# Patient Record
Sex: Male | Born: 1955 | Marital: Married | Smoking: Current every day smoker
Health system: Southern US, Community
[De-identification: ages and names within clinical notes are randomized; demographics above are authoritative.]

## PROBLEM LIST (undated history)

## (undated) DIAGNOSIS — I10 Essential (primary) hypertension: Secondary | ICD-10-CM

## (undated) HISTORY — DX: Essential (primary) hypertension: I10

---

## 2013-05-17 ENCOUNTER — Other Ambulatory Visit: Payer: Self-pay

## 2013-05-17 ENCOUNTER — Other Ambulatory Visit: Payer: Self-pay | Admitting: Infectious Diseases

## 2013-05-17 DIAGNOSIS — R7611 Nonspecific reaction to tuberculin skin test without active tuberculosis: Secondary | ICD-10-CM

## 2013-05-21 ENCOUNTER — Ambulatory Visit
Admission: RE | Admit: 2013-05-21 | Discharge: 2013-05-21 | Disposition: A | Discharge status: Home / Self Care | Payer: No Typology Code available for payment source | Source: Ambulatory Visit | Attending: Infectious Diseases | Admitting: Infectious Diseases

## 2013-05-21 DIAGNOSIS — R7611 Nonspecific reaction to tuberculin skin test without active tuberculosis: Secondary | ICD-10-CM

## 2013-12-26 DIAGNOSIS — I10 Essential (primary) hypertension: Secondary | ICD-10-CM

## 2013-12-26 HISTORY — DX: Essential (primary) hypertension: I10

## 2015-07-28 ENCOUNTER — Encounter (HOSPITAL_COMMUNITY): Payer: Self-pay | Admitting: Emergency Medicine

## 2015-07-28 ENCOUNTER — Emergency Department (INDEPENDENT_AMBULATORY_CARE_PROVIDER_SITE_OTHER)
Admission: EM | Admit: 2015-07-28 | Discharge: 2015-07-28 | Disposition: A | Discharge status: Home / Self Care | Payer: Self-pay | Source: Home / Self Care

## 2015-07-28 DIAGNOSIS — I160 Hypertensive urgency: Secondary | ICD-10-CM

## 2015-07-28 DIAGNOSIS — F17200 Nicotine dependence, unspecified, uncomplicated: Secondary | ICD-10-CM

## 2015-07-28 DIAGNOSIS — F1020 Alcohol dependence, uncomplicated: Secondary | ICD-10-CM

## 2015-07-28 MED ORDER — METOPROLOL SUCCINATE ER 25 MG PO TB24: 25.0000 mg | ORAL_TABLET | Freq: Every day | ORAL | Status: DC

## 2015-07-28 NOTE — Discharge Instructions (Signed)
I strongly recommend that you try to cut down on her drinking and then try to cut down on smoking. You probably have some damage to your liver because of your drinking and I would recommend that when you go to the [facility] on [address] within a week you get some blood work done to rule out any abnormalities of your liver. Alcohol can cause various abnormalities in your body and your high blood pressure and your fast heart rate may be related to probably the use of alcohol and its withdrawal from her system. Please consider getting some help in order to get off of the alcohol if you feel like you are ready to do so   Alcohol Use Disorder Alcohol use disorder is a mental disorder. It is not a one-time incident of heavy drinking. Alcohol use disorder is the excessive and uncontrollable use of alcohol over time that leads to problems with functioning in one or more areas of daily living. People with this disorder risk harming themselves and others when they drink to excess. Alcohol use disorder also can cause other mental disorders, such as mood and anxiety disorders, and serious physical problems. People with alcohol use disorder often misuse other drugs.  Alcohol use disorder is common and widespread. Some people with this disorder drink alcohol to cope with or escape from negative life events. Others drink to relieve chronic pain or symptoms of mental illness. People with a family history of alcohol use disorder are at higher risk of losing control and using alcohol to excess.  SYMPTOMS  Signs and symptoms of alcohol use disorder may include the following:   Consumption ofalcohol inlarger amounts or over a longer period of time than intended.  Multiple unsuccessful attempts to cutdown or control alcohol use.   A great deal of time spent obtaining alcohol, using alcohol, or recovering from the effects of alcohol (hangover).  A strong desire or urge to use alcohol (cravings).   Continued  use of alcohol despite problems at work, school, or home because of alcohol use.   Continued use of alcohol despite problems in relationships because of alcohol use.  Continued use of alcohol in situations when it is physically hazardous, such as driving a car.  Continued use of alcohol despite awareness of a physical or psychological problem that is likely related to alcohol use. Physical problems related to alcohol use can involve the brain, heart, liver, stomach, and intestines. Psychological problems related to alcohol use include intoxication, depression, anxiety, psychosis, delirium, and dementia.   The need for increased amounts of alcohol to achieve the same desired effect, or a decreased effect from the consumption of the same amount of alcohol (tolerance).  Withdrawal symptoms upon reducing or stopping alcohol use, or alcohol use to reduce or avoid withdrawal symptoms. Withdrawal symptoms include:  Racing heart.  Hand tremor.  Difficulty sleeping.  Nausea.  Vomiting.  Hallucinations.  Restlessness.  Seizures. DIAGNOSIS Alcohol use disorder is diagnosed through an assessment by your health care provider. Your health care provider may start by asking three or four questions to screen for excessive or problematic alcohol use. To confirm a diagnosis of alcohol use disorder, at least two symptoms must be present within a 38-month period. The severity of alcohol use disorder depends on the number of symptoms:  Mild--two or three.  Moderate--four or five.  Severe--six or more. Your health care provider may perform a physical exam or use results from lab tests to see if you have physical problems  resulting from alcohol use. Your health care provider may refer you to a mental health professional for evaluation. TREATMENT  Some people with alcohol use disorder are able to reduce their alcohol use to low-risk levels. Some people with alcohol use disorder need to quit drinking  alcohol. When necessary, mental health professionals with specialized training in substance use treatment can help. Your health care provider can help you decide how severe your alcohol use disorder is and what type of treatment you need. The following forms of treatment are available:   Detoxification. Detoxification involves the use of prescription medicines to prevent alcohol withdrawal symptoms in the first week after quitting. This is important for people with a history of symptoms of withdrawal and for heavy drinkers who are likely to have withdrawal symptoms. Alcohol withdrawal can be dangerous and, in severe cases, cause death. Detoxification is usually provided in a hospital or in-patient substance use treatment facility.  Counseling or talk therapy. Talk therapy is provided by substance use treatment counselors. It addresses the reasons people use alcohol and ways to keep them from drinking again. The goals of talk therapy are to help people with alcohol use disorder find healthy activities and ways to cope with life stress, to identify and avoid triggers for alcohol use, and to handle cravings, which can cause relapse.  Medicines.Different medicines can help treat alcohol use disorder through the following actions:  Decrease alcohol cravings.  Decrease the positive reward response felt from alcohol use.  Produce an uncomfortable physical reaction when alcohol is used (aversion therapy).  Support groups. Support groups are run by people who have quit drinking. They provide emotional support, advice, and guidance. These forms of treatment are often combined. Some people with alcohol use disorder benefit from intensive combination treatment provided by specialized substance use treatment centers. Both inpatient and outpatient treatment programs are available. Document Released: 01/19/2005 Document Revised: 04/28/2014 Document Reviewed: 03/21/2013 [facility] Patient Information 2015  [place], [place]. This information is not intended to replace advice given to you by your health care provider. Make sure you discuss any questions you have with your health care provider.

## 2015-07-28 NOTE — ED Provider Notes (Signed)
CSN: [id number]     Arrival date & time 07/28/15  1516 History   First MD Initiated Contact with Patient 07/28/15 1525     No chief complaint on file.  HPI  59 y/o ? recent [place] to [place] from [place] [facility] [address] No known medical illnesses per Interpreter Smoker-smokes ~ 1 pack q 3 says Etoh-occasional Not on any medicaton currently No prior operation before vision is fine-no Glasses  Used to work at [employer] get there as doesn't drive He tells me that he is applying for a job at [employer] and is able to get work there as early as 07/29/15 if he gets a work note saying that he is permitted to do so  His father and mother died in the 40s of old age she does not know from what He has not ever been on any medication in the past He drinks 1 40 of beer a day and smokes as well as uses chewable tobacco No illicit drug use  He has 3 children and lives with his wife here in [place] He has never withdrawn from his alcohol and does not seem amenable to quitting  He has no drug allergies or any other issues that he is aware of  From what I am able to tell he tells me he has never been on any blood pressure medication   BP 168/90 mmHg  Pulse 106  Temp(Src) 99.2 F (37.3 C) (Oral)  Resp 16  SpO2 97%   No past medical history on file. No past surgical history on file. No family history on file. History  Substance Use Topics  . Smoking status: Not on file  . Smokeless tobacco: Not on file  . Alcohol Use: Not on file    Review of Systems  - Fever, minus and chill, - dysuria, - dark stool, - tarry stool, minus abdominal pain, some nausea, and some vomiting, - weight loss, - rash  + Icterus   Allergies  Review of patient's allergies indicates not on file.  Home Medications   Prior to Admission medications   Not on File   There were no vitals taken for this visit. Physical Exam Slightly built moderately  nourished, pleasant, scleral icterus bilaterally with injection/sob conjunctiva hemorrhage to the left eye lower iris area  Very poor dentition with multiple caries and eroded teeth  No thyromegaly, no submandibular submental lymphadenopathy  Tachycardic 100 range   clinically clear no added sound no rales no rhonchi no fremitus no resonance Abdomen is soft I think I feel some right subcostal fullness which may be his liver however I am not sure given he guards No lower extremity edema Tattoo right arm and the left arm Neurologically intact, musculoskeletal able to move shoulders in all 4 cardinal positions including flexion and extension and rotation chin to chest flexion and extension are normal as is axial rotation of the head Patient is able to forward flex and backward extend at the hip without limitation Musci skeletal exam in the lower extremities is normal in terms of plantar and dorsiflexion as well as hip flexion and extension gait is normal   ED Course  Procedures (including critical care time) Labs Review Labs Reviewed - No data to display  Imaging Review No results found.   MDM   1. Asymptomatic hypertensive urgency   2. Smoker unmotivated to quit   3. ETOHism    patient comes in for a general physical in  order to be able to be cleared for work at [employer]. I have cleared him for work from a Education officer, environmental standpoint however I feel biochemically he may have ethanolism and will need complete metabolic panel, CBC, INR, hepatitis workup given the fact that he is clearly icteric on exam. As he however is able to function and has not had any blackout spells per the interpreter and based on what I'm able to discern as the patient does speak [place]ese, I do feel that he needs close attention at the health department for further screening and preventive care in terms of attention a hypothyroid, hepatitis, lung cancer, hepatitis and diabetes guidelines as per [place] PT[facility]. He  will be given a letter in order for him to obtain work at [employer] I have made it clear to the interpreter that he will need to get some blood pressure medication started and I have started a selective beta blocker metoprolol XL 25 mg daily as it does not cross the blood-brain barrier and hence would have lower propensity to cause depression or anorgasmia/ejection icteric problems in males. I do think however that he will need close follow-up and I have mentioned this to his interpreter to make sure that this occurs.  Everything was carefully explained to the patient via the interpreter  Corey Skains, MD 07/28/15 [phone]

## 2015-07-28 NOTE — ED Notes (Signed)
Via [place]ese interpreter... Pt is a refugee and is need of medical clearance so he may begin to work [employer] no other concerns... No PCP Alert, no acute distress.

## 2016-04-13 ENCOUNTER — Ambulatory Visit: Payer: Self-pay

## 2016-05-04 ENCOUNTER — Ambulatory Visit: Payer: Self-pay

## 2016-09-21 ENCOUNTER — Ambulatory Visit (INDEPENDENT_AMBULATORY_CARE_PROVIDER_SITE_OTHER): Payer: Self-pay | Admitting: Internal Medicine

## 2016-09-21 ENCOUNTER — Encounter: Payer: Self-pay | Admitting: Internal Medicine

## 2016-09-21 VITALS — BP 168/98 | HR 82 | Temp 98.6°F | Ht 66.0 in | Wt 129.5 lb

## 2016-09-21 DIAGNOSIS — I10 Essential (primary) hypertension: Secondary | ICD-10-CM

## 2016-09-21 DIAGNOSIS — R21 Rash and other nonspecific skin eruption: Secondary | ICD-10-CM

## 2016-09-21 MED ORDER — METOPROLOL TARTRATE 25 MG PO TABS: 25.0000 mg | ORAL_TABLET | Freq: Two times a day (BID) | ORAL | 11 refills | Status: AC

## 2016-09-21 MED ORDER — TRIAMCINOLONE ACETONIDE 0.1 % EX CREA: TOPICAL_CREAM | CUTANEOUS | 1 refills | Status: AC

## 2016-09-21 NOTE — Progress Notes (Signed)
Subjective:    Patient ID: Jonathon Dennis, male    DOB: 03/19/1956, 60 y.o.   MRN: [id number]  HPI   New to establish  1.  Elevated BP:  Has been told his blood pressure is high in past--2 years ago.  Has never had treatment.  (later see he was prescribed Metoprolol, but he took only for 2 weeks) He is physically active.  Unemployed.  Eats lots of fruits and vegetables in his diet.   2.  Rash:   Has had rash for 1 month.  Has never had before.  Started on inner thighs of both legs.  Now involving all of legs.   Is pruritic No pets or animals about.   No problems with insects in his home.   No new topical or other type contacts to skin.   He did walk [place] the creek to fish/gets snails for soup.  Does not recall going through weeds, however. Does not recall name of soap  No outpatient prescriptions have been marked as taking for the 09/21/16 encounter (Office Visit) with Julieanne MansonElizabeth Anorah Trias, MD.   No Known Allergies   Past Medical History:  Diagnosis Date  . Hypertension 2015   History reviewed. No pertinent surgical history.   History reviewed. No pertinent family history.   Social History   Social History  . Marital status: Married    Spouse name: Matachi  . Number of children: 4  . Years of education: N/A   Occupational History  . unemployed     Previously worked at [employer], but does not drive.    .      Takes care of 2 grandchildren (daughter's refugee paperwork did not come through) and 1 son.   Social History Main Topics  . Smoking status: Current Every Day Smoker    Packs/day: 0.25    Years: 8.00    Types: Cigarettes  . Smokeless tobacco: Never Used  . Alcohol use Yes     Comment: drinks liquor once weekly with friends--not able to clarify, very  vague.  . Drug use: No  . Sexual activity: Not on file   Other Topics Concern  . Not on file   Social History Narrative   Originally from [place]   In [place] years   Lives at home with  wife, 58 yo son with developmental disability from sounds of it and 2 grandchildren (his daughter's papers did not come through, so she could not come as a refugee)   Speaks Burmese     Review of Systems     Objective:   Physical Exam  NAD HEENT:  PERRL, EOMI, TMs pearly gray, throat without injection. Neck:  Supple no adenopathy, no thyromegaly Chest:  CTA CV:  RRR with normal S1 and S2, No S3, S4 or murmur, radial and DP pulses normal and equal. Legs and feet:  Large areas of circular violaceous base with overlying dry flaky skin, lower legs and onto ankles, dorsal feet. Appears to have separate thickened flaky skin involving toes, including interdigital spaces      Assessment & Plan:  1.  Leg rash:  Not clear what this is.  Appears to be possibly two separate problems.  Triamcinolone and Terbinafine cream twice daily to feet.  Not to use triamcinolone on toes or inderdigital spaces.   To let us know if no better when returns for bp check in 2 weeks.  2.  Essential Hypertension:  Start Metoprolol twice daily 25  mg.  Bp/pulse  check 2 weeks.

## 2016-09-21 NOTE — Patient Instructions (Addendum)
Dove soap Terbinafine cream to dry rashy skin on feet twice daily, especially after shower Triamcinolone Cream (the prescription cream) over legs and ankles and tops of feet where rash is, but not between toes or on bottoms of feet. Cover all skin twice daily with Eucerin Cream for Eczema Relief

## 2016-10-05 ENCOUNTER — Ambulatory Visit (INDEPENDENT_AMBULATORY_CARE_PROVIDER_SITE_OTHER): Payer: Self-pay | Admitting: Internal Medicine

## 2016-10-05 ENCOUNTER — Encounter: Payer: Self-pay | Admitting: Internal Medicine

## 2016-10-05 VITALS — BP 118/80 | HR 82

## 2016-10-05 DIAGNOSIS — I10 Essential (primary) hypertension: Secondary | ICD-10-CM

## 2016-10-05 NOTE — Progress Notes (Signed)
Here for nurse visit blood pressure check. Measurements by Richrd SoxEstefania Alfaro Ruiz  Discussed his bp is fine.  Tolerating med fine.

## 2016-11-23 ENCOUNTER — Ambulatory Visit: Payer: Self-pay | Admitting: Internal Medicine

## 2019-03-09 ENCOUNTER — Emergency Department (HOSPITAL_COMMUNITY): Payer: Self-pay

## 2019-03-09 ENCOUNTER — Inpatient Hospital Stay (HOSPITAL_COMMUNITY)
Admission: EM | Admit: 2019-03-09 | Discharge: 2019-03-27 | DRG: 082 | Disposition: E | Payer: Self-pay | Attending: Pulmonary Disease | Admitting: Pulmonary Disease

## 2019-03-09 ENCOUNTER — Encounter (HOSPITAL_COMMUNITY): Payer: Self-pay | Admitting: Neurological Surgery

## 2019-03-09 ENCOUNTER — Other Ambulatory Visit: Payer: Self-pay

## 2019-03-09 DIAGNOSIS — Z7189 Other specified counseling: Secondary | ICD-10-CM

## 2019-03-09 DIAGNOSIS — S0291XA Unspecified fracture of skull, initial encounter for closed fracture: Secondary | ICD-10-CM

## 2019-03-09 DIAGNOSIS — K920 Hematemesis: Secondary | ICD-10-CM

## 2019-03-09 DIAGNOSIS — E440 Moderate protein-calorie malnutrition: Secondary | ICD-10-CM

## 2019-03-09 DIAGNOSIS — W109XXA Fall (on) (from) unspecified stairs and steps, initial encounter: Secondary | ICD-10-CM | POA: Diagnosis present

## 2019-03-09 DIAGNOSIS — Z4659 Encounter for fitting and adjustment of other gastrointestinal appliance and device: Secondary | ICD-10-CM

## 2019-03-09 DIAGNOSIS — Z9911 Dependence on respirator [ventilator] status: Secondary | ICD-10-CM

## 2019-03-09 DIAGNOSIS — R4701 Aphasia: Secondary | ICD-10-CM | POA: Diagnosis present

## 2019-03-09 DIAGNOSIS — Z6820 Body mass index (BMI) 20.0-20.9, adult: Secondary | ICD-10-CM

## 2019-03-09 DIAGNOSIS — K922 Gastrointestinal hemorrhage, unspecified: Secondary | ICD-10-CM | POA: Diagnosis present

## 2019-03-09 DIAGNOSIS — J9601 Acute respiratory failure with hypoxia: Secondary | ICD-10-CM | POA: Diagnosis present

## 2019-03-09 DIAGNOSIS — Y92003 Bedroom of unspecified non-institutional (private) residence as the place of occurrence of the external cause: Secondary | ICD-10-CM

## 2019-03-09 DIAGNOSIS — G919 Hydrocephalus, unspecified: Secondary | ICD-10-CM | POA: Diagnosis present

## 2019-03-09 DIAGNOSIS — Z66 Do not resuscitate: Secondary | ICD-10-CM | POA: Diagnosis present

## 2019-03-09 DIAGNOSIS — D649 Anemia, unspecified: Secondary | ICD-10-CM | POA: Diagnosis present

## 2019-03-09 DIAGNOSIS — I619 Nontraumatic intracerebral hemorrhage, unspecified: Secondary | ICD-10-CM | POA: Diagnosis present

## 2019-03-09 DIAGNOSIS — J189 Pneumonia, unspecified organism: Secondary | ICD-10-CM | POA: Diagnosis not present

## 2019-03-09 DIAGNOSIS — G9349 Other encephalopathy: Secondary | ICD-10-CM | POA: Diagnosis present

## 2019-03-09 DIAGNOSIS — F17210 Nicotine dependence, cigarettes, uncomplicated: Secondary | ICD-10-CM | POA: Diagnosis present

## 2019-03-09 DIAGNOSIS — R64 Cachexia: Secondary | ICD-10-CM | POA: Diagnosis present

## 2019-03-09 DIAGNOSIS — E785 Hyperlipidemia, unspecified: Secondary | ICD-10-CM | POA: Diagnosis present

## 2019-03-09 DIAGNOSIS — R159 Full incontinence of feces: Secondary | ICD-10-CM | POA: Diagnosis present

## 2019-03-09 DIAGNOSIS — G8191 Hemiplegia, unspecified affecting right dominant side: Secondary | ICD-10-CM | POA: Diagnosis present

## 2019-03-09 DIAGNOSIS — S0280XA Fracture of other specified skull and facial bones, unspecified side, initial encounter for closed fracture: Secondary | ICD-10-CM | POA: Diagnosis present

## 2019-03-09 DIAGNOSIS — W19XXXA Unspecified fall, initial encounter: Secondary | ICD-10-CM

## 2019-03-09 DIAGNOSIS — R0902 Hypoxemia: Secondary | ICD-10-CM

## 2019-03-09 DIAGNOSIS — F1010 Alcohol abuse, uncomplicated: Secondary | ICD-10-CM

## 2019-03-09 DIAGNOSIS — D696 Thrombocytopenia, unspecified: Secondary | ICD-10-CM | POA: Diagnosis present

## 2019-03-09 DIAGNOSIS — E876 Hypokalemia: Secondary | ICD-10-CM | POA: Diagnosis not present

## 2019-03-09 DIAGNOSIS — F1020 Alcohol dependence, uncomplicated: Secondary | ICD-10-CM | POA: Diagnosis present

## 2019-03-09 DIAGNOSIS — Y95 Nosocomial condition: Secondary | ICD-10-CM | POA: Diagnosis not present

## 2019-03-09 DIAGNOSIS — I10 Essential (primary) hypertension: Secondary | ICD-10-CM | POA: Diagnosis present

## 2019-03-09 DIAGNOSIS — G935 Compression of brain: Secondary | ICD-10-CM

## 2019-03-09 DIAGNOSIS — G936 Cerebral edema: Secondary | ICD-10-CM

## 2019-03-09 DIAGNOSIS — Z515 Encounter for palliative care: Secondary | ICD-10-CM | POA: Diagnosis present

## 2019-03-09 DIAGNOSIS — S066X9A Traumatic subarachnoid hemorrhage with loss of consciousness of unspecified duration, initial encounter: Principal | ICD-10-CM | POA: Diagnosis present

## 2019-03-09 LAB — COMPREHENSIVE METABOLIC PANEL
ALT: 56 U/L — AB (ref 0–44)
AST: 86 U/L — AB (ref 15–41)
Albumin: 3.6 g/dL (ref 3.5–5.0)
Alkaline Phosphatase: 64 U/L (ref 38–126)
Anion gap: 19 — ABNORMAL HIGH (ref 5–15)
BUN: 18 mg/dL (ref 8–23)
CO2: 19 mmol/L — ABNORMAL LOW (ref 22–32)
CREATININE: 0.72 mg/dL (ref 0.61–1.24)
Calcium: 9.1 mg/dL (ref 8.9–10.3)
Chloride: 96 mmol/L — ABNORMAL LOW (ref 98–111)
GFR calc Af Amer: >60 mL/min (ref >60)
GFR calc non Af Amer: >60 mL/min (ref >60)
Glucose, Bld: 163 mg/dL — ABNORMAL HIGH (ref 70–99)
Potassium: 4.1 mmol/L (ref 3.5–5.1)
Sodium: 134 mmol/L — ABNORMAL LOW (ref 135–145)
Total Bilirubin: 1.1 mg/dL (ref 0.3–1.2)
Total Protein: 9.1 g/dL — ABNORMAL HIGH (ref 6.5–8.1)

## 2019-03-09 LAB — CBC WITH DIFFERENTIAL/PLATELET
Abs Immature Granulocytes: 0.03 10*3/uL (ref 0.00–0.07)
Basophils Absolute: 0 10*3/uL (ref 0.0–0.1)
Basophils Relative: 0 %
Eosinophils Absolute: 0 10*3/uL (ref 0.0–0.5)
Eosinophils Relative: 0 %
HCT: 33 % — ABNORMAL LOW (ref 39.0–52.0)
Hemoglobin: 10.5 g/dL — ABNORMAL LOW (ref 13.0–17.0)
Immature Granulocytes: 0 %
Lymphocytes Relative: 6 %
Lymphs Abs: 0.6 10*3/uL — ABNORMAL LOW (ref 0.7–4.0)
MCH: 20 pg — ABNORMAL LOW (ref 26.0–34.0)
MCHC: 31.8 g/dL (ref 30.0–36.0)
MCV: 63 fL — ABNORMAL LOW (ref 80.0–100.0)
Monocytes Absolute: 0.6 10*3/uL (ref 0.1–1.0)
Monocytes Relative: 6 %
Neutro Abs: 8.3 10*3/uL — ABNORMAL HIGH (ref 1.7–7.7)
Neutrophils Relative %: 88 %
Platelets: 147 10*3/uL — ABNORMAL LOW (ref 150–400)
RBC: 5.24 MIL/uL (ref 4.22–5.81)
RDW: 16.4 % — ABNORMAL HIGH (ref 11.5–15.5)
WBC: 9.5 10*3/uL (ref 4.0–10.5)
nRBC: 0.3 % — ABNORMAL HIGH (ref 0.0–0.2)

## 2019-03-09 LAB — TYPE AND SCREEN
ABO/RH(D): O POS
ANTIBODY SCREEN: NEGATIVE

## 2019-03-09 LAB — RAPID URINE DRUG SCREEN, HOSP PERFORMED
Amphetamines: NOT DETECTED
BARBITURATES: NOT DETECTED
Benzodiazepines: NOT DETECTED
Cocaine: NOT DETECTED
Opiates: NOT DETECTED
Tetrahydrocannabinol: NOT DETECTED

## 2019-03-09 LAB — POCT I-STAT 7, (LYTES, BLD GAS, ICA,H+H)
Acid-Base Excess: 2 mmol/L (ref 0.0–2.0)
Bicarbonate: 26.1 mmol/L (ref 20.0–28.0)
Calcium, Ion: 1.1 mmol/L — ABNORMAL LOW (ref 1.15–1.40)
HCT: 27 % — ABNORMAL LOW (ref 39.0–52.0)
Hemoglobin: 9.2 g/dL — ABNORMAL LOW (ref 13.0–17.0)
O2 Saturation: 100 %
PO2 ART: 317 mmHg — AB (ref 83.0–108.0)
Potassium: 4.5 mmol/L (ref 3.5–5.1)
Sodium: 135 mmol/L (ref 135–145)
TCO2: 27 mmol/L (ref 22–32)
pCO2 arterial: 36.5 mmHg (ref 32.0–48.0)
pH, Arterial: 7.462 — ABNORMAL HIGH (ref 7.350–7.450)

## 2019-03-09 LAB — POC OCCULT BLOOD, ED: Fecal Occult Bld: NEGATIVE

## 2019-03-09 LAB — URINALYSIS, ROUTINE W REFLEX MICROSCOPIC
Bacteria, UA: NONE SEEN
Bilirubin Urine: NEGATIVE
Glucose, UA: NEGATIVE mg/dL
Ketones, ur: 20 mg/dL — AB
LEUKOCYTE UA: NEGATIVE
Nitrite: NEGATIVE
Protein, ur: 100 mg/dL — AB
Specific Gravity, Urine: 1.02 (ref 1.005–1.030)
pH: 5 (ref 5.0–8.0)

## 2019-03-09 LAB — ABO/RH: ABO/RH(D): O POS

## 2019-03-09 LAB — CBG MONITORING, ED: Glucose-Capillary: 165 mg/dL — ABNORMAL HIGH (ref 70–99)

## 2019-03-09 LAB — PROTIME-INR
INR: 1.1 (ref 0.8–1.2)
Prothrombin Time: 14.2 seconds (ref 11.4–15.2)

## 2019-03-09 LAB — LACTIC ACID, PLASMA
Lactic Acid, Venous: 4.9 mmol/L (ref 0.5–1.9)
Lactic Acid, Venous: 5 mmol/L (ref 0.5–1.9)

## 2019-03-09 LAB — APTT: aPTT: 31 seconds (ref 24–36)

## 2019-03-09 LAB — TROPONIN I: Troponin I: <0.03 ng/mL (ref <0.03)

## 2019-03-09 LAB — MRSA PCR SCREENING: MRSA by PCR: NEGATIVE

## 2019-03-09 LAB — ETHANOL: Alcohol, Ethyl (B): <10 mg/dL (ref <10)

## 2019-03-09 LAB — LIPASE, BLOOD: Lipase: 24 U/L (ref 11–51)

## 2019-03-09 MED ORDER — ACETAMINOPHEN 160 MG/5ML PO SOLN
650.0000 mg | Freq: Four times a day (QID) | ORAL | Status: DC | PRN
  Administered 2019-03-09 – 2019-03-14 (×11): 650 mg
  Filled 2019-03-09 (×10): qty 20.3

## 2019-03-09 MED ORDER — SODIUM CHLORIDE 0.9 % IV SOLN
80.0000 mg | Freq: Once | INTRAVENOUS | Status: AC
  Administered 2019-03-09: 80 mg via INTRAVENOUS
  Filled 2019-03-09: qty 80

## 2019-03-09 MED ORDER — ETOMIDATE 2 MG/ML IV SOLN
INTRAVENOUS | Status: AC | PRN
  Administered 2019-03-09: 15 mg via INTRAVENOUS

## 2019-03-09 MED ORDER — SODIUM CHLORIDE 3 % IV SOLN
INTRAVENOUS | Status: AC
  Administered 2019-03-09: 50 mL/h via INTRAVENOUS
  Filled 2019-03-09 (×5): qty 500

## 2019-03-09 MED ORDER — SUCCINYLCHOLINE CHLORIDE 20 MG/ML IJ SOLN
INTRAMUSCULAR | Status: AC | PRN
  Administered 2019-03-09: 90 mg via INTRAVENOUS

## 2019-03-09 MED ORDER — ORAL CARE MOUTH RINSE
15.0000 mL | OROMUCOSAL | Status: DC
  Administered 2019-03-09 – 2019-03-14 (×47): 15 mL via OROMUCOSAL

## 2019-03-09 MED ORDER — PROPOFOL 1000 MG/100ML IV EMUL
5.0000 ug/kg/min | INTRAVENOUS | Status: DC
  Administered 2019-03-09: 20 ug/kg/min via INTRAVENOUS
  Administered 2019-03-09: 35 ug/kg/min via INTRAVENOUS
  Administered 2019-03-10: 25 ug/kg/min via INTRAVENOUS
  Administered 2019-03-11: 30 ug/kg/min via INTRAVENOUS
  Filled 2019-03-09 (×6): qty 100

## 2019-03-09 MED ORDER — CHLORHEXIDINE GLUCONATE 0.12% ORAL RINSE (MEDLINE KIT)
15.0000 mL | Freq: Two times a day (BID) | OROMUCOSAL | Status: DC
  Administered 2019-03-09 – 2019-03-14 (×9): 15 mL via OROMUCOSAL

## 2019-03-09 MED ORDER — FENTANYL CITRATE (PF) 100 MCG/2ML IJ SOLN
50.0000 ug | Freq: Once | INTRAMUSCULAR | Status: AC
  Administered 2019-03-09: 50 ug via INTRAVENOUS
  Filled 2019-03-09: qty 2

## 2019-03-09 MED ORDER — FENTANYL BOLUS VIA INFUSION
50.0000 ug | INTRAVENOUS | Status: DC | PRN
  Administered 2019-03-13 (×2): 50 ug via INTRAVENOUS

## 2019-03-09 MED ORDER — SODIUM CHLORIDE 0.9 % IV SOLN: 1.0000 g | Freq: Once | INTRAVENOUS | Status: DC

## 2019-03-09 MED ORDER — FENTANYL 2500MCG IN NS 250ML (10MCG/ML) PREMIX INFUSION
25.0000 ug/h | INTRAVENOUS | Status: DC
  Administered 2019-03-09: 50 ug/h via INTRAVENOUS
  Administered 2019-03-10: 100 ug/h via INTRAVENOUS
  Administered 2019-03-11: 150 ug/h via INTRAVENOUS
  Administered 2019-03-11: 300 ug/h via INTRAVENOUS
  Administered 2019-03-13: 50 ug/h via INTRAVENOUS
  Filled 2019-03-09 (×5): qty 250

## 2019-03-09 MED ORDER — SODIUM CHLORIDE 0.9 % IV SOLN
8.0000 mg/h | INTRAVENOUS | Status: DC
  Administered 2019-03-09 – 2019-03-12 (×7): 8 mg/h via INTRAVENOUS
  Filled 2019-03-09 (×9): qty 80

## 2019-03-09 MED ORDER — SODIUM CHLORIDE 0.9 % IV SOLN: 500.0000 mg | Freq: Once | INTRAVENOUS | Status: DC

## 2019-03-09 MED ORDER — SODIUM CHLORIDE 0.9 % IV BOLUS
1000.0000 mL | Freq: Once | INTRAVENOUS | Status: AC
  Administered 2019-03-09: 1000 mL via INTRAVENOUS

## 2019-03-09 MED ORDER — SODIUM CHLORIDE 0.9 % IV SOLN: INTRAVENOUS | Status: DC

## 2019-03-09 NOTE — ED Provider Notes (Signed)
[facility] EMERGENCY DEPARTMENT Provider Note   CSN: [id number] Arrival date & time: 03/28/2019  1037    History   Chief Complaint Chief Complaint  Patient presents with   Altered Mental Status   GI Bleeding    HPI Jonathon Dennis is a 63 y.o. male with history of alcoholism, hypertension presenting to emergency department today with with chief complaint of altered mental status.  Patient was brought in by EMS.  EMS reports they were called by his wife when she was unable to wake him this morning.  His wife reports he was covered in vomit when she went to check on him and was snoring loudly.  He was last known normal was yesterday evening.  She states he drank a lot of alcohol last night.  This is typical for him, she reports he drinks more than he eats.  Denies history of GI bleed, stroke. Patient was found covered in feces and vomit.   Level 5 caveat given patient's altered mental status.   Past Medical History:  Diagnosis Date   Hypertension 2015    Patient Active Problem List   Diagnosis Date Noted   Essential hypertension 12/26/2013    No past surgical history on file.      Home Medications    Prior to Admission medications   Medication Sig Start Date End Date Taking? Authorizing Provider  metoprolol tartrate (LOPRESSOR) 25 MG tablet Take 1 tablet (25 mg total) by mouth 2 (two) times daily. Patient not taking: Reported on 03-28-19 09/21/16   Julieanne Manson, MD  triamcinolone cream (KENALOG) 0.1 % Apply to rashy area of legs and ankles twice daily until rash resolves Patient not taking: Reported on Mar 28, 2019 09/21/16   Julieanne Manson, MD    Family History No family history on file.  Social History Social History   Tobacco Use   Smoking status: Current Every Day Smoker    Packs/day: 0.25    Years: 8.00    Pack years: 2.00    Types: Cigarettes   Smokeless tobacco: Never Used  Substance Use Topics   Alcohol use: Yes    Comment:  drinks liquor once weekly with friends--not able to clarify, very  vague.   Drug use: No     Allergies   Patient has no known allergies.   Review of Systems Review of Systems  Unable to perform ROS: Patient unresponsive     Physical Exam Updated Vital Signs BP 129/84    Pulse 71    Temp 99.1 F (37.3 C) (Rectal)    Resp (!) 28    Ht  (1.702 m)    Wt 58.7 kg    SpO2 100%    BMI 20.27 kg/m   Physical Exam Vitals signs and nursing note reviewed.  Constitutional:      Appearance: He is cachectic. He is ill-appearing.     Comments: Patient arrived covered in feces and vomit.  HENT:     Head: Normocephalic.     Mouth/Throat:     Pharynx: No posterior oropharyngeal erythema.     Comments: Patient's mouth is dry.  There appears to be dried blood in patient's mouth covering his tongue, teeth, oral mucosa. Eyes:     Pupils: Pupils are unequal.     Left eye: Pupil is not reactive.     Comments: Left preferential gaze.  Cardiovascular:     Rate and Rhythm: Normal rate and regular rhythm.     Pulses: Normal pulses.  Heart sounds: Normal heart sounds.  Pulmonary:     Breath sounds: Rhonchi present.     Comments: Patient with snoring respirations. Rhonchi heard throughout. Abdominal:     General: There is no distension.     Palpations: Abdomen is soft.     Tenderness: There is no abdominal tenderness.  Genitourinary:    Comments: Chaperone RN Thayer Ohm present for exam. Digital Rectal Exam reveals sphincter with good tone. No external hemorrhoids. No masses or fissures. Stool color is brown with no overt blood. No gross melena.  Musculoskeletal:     Right lower leg: No edema.     Left lower leg: No edema.  Skin:    Findings: No rash.  Neurological:     Mental Status: He is unresponsive.     Comments: Patient does not follow commands.  Pt has response to painful stimuli. He has not moved right upper extremity. Occasionally moved left upper extremity while being  repositioned. Positive Babinski sign bilaterally.       ED Treatments / Results  Labs (all labs ordered are listed, but only abnormal results are displayed) Labs Reviewed  CBC WITH DIFFERENTIAL/PLATELET - Abnormal; Notable for the following components:      Result Value   Hemoglobin 10.5 (*)    HCT 33.0 (*)    MCV 63.0 (*)    MCH 20.0 (*)    RDW 16.4 (*)    Platelets 147 (*)    nRBC 0.3 (*)    Neutro Abs 8.3 (*)    Lymphs Abs 0.6 (*)    All other components within normal limits  COMPREHENSIVE METABOLIC PANEL - Abnormal; Notable for the following components:   Sodium 134 (*)    Chloride 96 (*)    CO2 19 (*)    Glucose, Bld 163 (*)    Total Protein 9.1 (*)    AST 86 (*)    ALT 56 (*)    Anion gap 19 (*)    All other components within normal limits  LACTIC ACID, PLASMA - Abnormal; Notable for the following components:   Lactic Acid, Venous 4.9 (*)    All other components within normal limits  LACTIC ACID, PLASMA - Abnormal; Notable for the following components:   Lactic Acid, Venous 5.0 (*)    All other components within normal limits  URINALYSIS, ROUTINE W REFLEX MICROSCOPIC - Abnormal; Notable for the following components:   Hgb urine dipstick MODERATE (*)    Ketones, ur 20 (*)    Protein, ur 100 (*)    All other components within normal limits  CBG MONITORING, ED - Abnormal; Notable for the following components:   Glucose-Capillary 165 (*)    All other components within normal limits  POCT I-STAT 7, (LYTES, BLD GAS, ICA,H+H) - Abnormal; Notable for the following components:   pH, Arterial 7.462 (*)    pO2, Arterial 317.0 (*)    Calcium, Ion 1.10 (*)    HCT 27.0 (*)    Hemoglobin 9.2 (*)    All other components within normal limits  CULTURE, BLOOD (ROUTINE X 2)  CULTURE, BLOOD (ROUTINE X 2)  PROTIME-INR  LIPASE, BLOOD  TROPONIN I  RAPID URINE DRUG SCREEN, HOSP PERFORMED  APTT  ETHANOL  POC OCCULT BLOOD, ED  TYPE AND SCREEN  ABO/RH    EKG EKG  Interpretation  Date/Time:  Saturday 03/01/2019 11:05:57 EDT Ventricular Rate:  71 PR Interval:    QRS Duration: 91 QT Interval:  412 QTC Calculation:  448 R Axis:   39 Text Interpretation:  Sinus rhythm Left ventricular hypertrophy No old tracing to compare Confirmed by Meridee Score [phone]) on 03/12/2019 12:01:42 PM   Radiology Ct Head Wo Contrast  Result Date: 02/28/2019 CLINICAL DATA:  63 year old male with altered mental status. EXAM: CT HEAD WITHOUT CONTRAST TECHNIQUE: Contiguous axial images were obtained from the base of the skull through the vertex without intravenous contrast. COMPARISON:  None. FINDINGS: Brain: Acute LEFT frontoparietal and RIGHT frontal intraparenchymal hemorrhage is noted. The LEFT frontoparietal intraparenchymal hemorrhage measures 5 x 9 cm, extends into the ventricular system, contributing to 1.5 cm LEFT to RIGHT midline shift and RIGHT hydrocephalus. The RIGHT frontal intraparenchymal hemorrhage measures 1.4 x 3.4 cm. Bilateral subarachnoid hemorrhage noted, RIGHT greater than LEFT. 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 Vascular: Carotid atherosclerotic calcifications noted. Skull: A nondisplaced fracture at the skull vertex is noted. Sinuses/Orbits: Mucosal thickening within paranasal sinuses noted. Opacified LEFT sphenoid sinus identified. Other: Oral intubation tube noted. IMPRESSION: 1. Bilateral intraparenchymal hemorrhage, LEFT greater than RIGHT, with intraventricular extension, 1.5 cm LEFT to RIGHT midline shift, RIGHT hydrocephalus, and bilateral subarachnoid hemorrhage,. LEFT frontoparietal intraparenchymal hemorrhage measures 5 x 9 cm and RIGHT frontal hemorrhage measures 1.4 x 3.4 cm. 2. Nondisplaced fracture of the skull vertex. Critical Value/emergent results were called by telephone at the time of interpretation on 03/23/2019 at 3:37 pm to [facility] , who verbally acknowledged these results. Electronically Signed   By: Harmon Pier M.D.   On: 03/13/2019 15:41   Dg Chest Portable 1 View  Result Date: 03/23/2019 CLINICAL DATA:  ETT and OG tube placement. EXAM: PORTABLE CHEST 1 VIEW COMPARISON:  March 09, 2019 FINDINGS: The ETT terminates in the mid trachea, in good position. The OG tube terminates below today's film. No pneumothorax. The cardiomediastinal silhouette is normal. The lungs are clear. No other acute abnormalities. IMPRESSION: Support apparatus as above.  No acute abnormalities in the chest. Electronically Signed   By: Gerome Sam III M.D   On: 03/10/2019 14:55   Dg Chest Port 1 View  Result Date: 03/03/2019 CLINICAL DATA:  Altered mental status EXAM: PORTABLE CHEST 1 VIEW COMPARISON:  05/21/2013 FINDINGS: The cardiomediastinal silhouette is unremarkable. There is no evidence of focal airspace disease, pulmonary edema, suspicious pulmonary nodule/mass, pleural effusion, or pneumothorax. No acute bony abnormalities are identified. IMPRESSION: No active disease. Electronically Signed   By: Harmon Pier M.D.   On: 02/26/2019 12:31    Procedures Procedures (including critical care time)  Medications Ordered in ED Medications  pantoprazole (PROTONIX) 80 mg in sodium chloride 0.9 % 250 mL (0.32 mg/mL) infusion (8 mg/hr Intravenous New Bag/Given 03/22/2019 1452)  propofol (DIPRIVAN) 1000 MG/100ML infusion (35 mcg/kg/min  58.7 kg Intravenous Rate/Dose Change 03/01/2019 1545)  fentaNYL in NS (72mcg/ml) infusion-PREMIX (50 mcg/hr Intravenous New Bag/Given 02/26/2019 1456)  fentaNYL (SUBLIMAZE) bolus via infusion 50 mcg (has no administration in time range)  cefTRIAXone (ROCEPHIN) 1 g in sodium chloride 0.9 % 100 mL IVPB (has no administration in time range)  azithromycin (ZITHROMAX) 500 mg in sodium chloride 0.9 % 250 mL IVPB (has no administration in time range)  sodium chloride 0.9 % bolus 1,000 mL (1,000 mLs Intravenous New Bag/Given 03/13/2019 1337)  pantoprazole (PROTONIX) 80 mg in sodium chloride 0.9 % 100  mL IVPB (0 mg Intravenous Stopped 03/25/2019 1402)  etomidate (AMIDATE) injection (15 mg Intravenous Given 03/22/2019 1420)  succinylcholine (ANECTINE) injection (90 mg Intravenous Given 02/27/2019 1423)  fentaNYL (  SUBLIMAZE) injection 50 mcg (50 mcg Intravenous Given by Other 28-Mar-2019 1459)     Initial Impression / Assessment and Plan / ED Course  I have reviewed the triage vital signs and the nursing notes.  Pertinent labs & imaging results that were available during my care of the patient were reviewed by me and considered in my medical decision making (see chart for details).  Clinical Course as of Mar 08 1641  Sat Mar 28, 2019  7862 63 year old male Burmese speaking here after altered mental status at home.  History is limited but wife said drank alcohol yesterday which is normal for patient.  Here he is less responsive and just vomited coffee-ground material.  He is coughing and protecting his airway at the moment.  IV established fluids going.  Possibly will need intubation.  Has left preferential gaze unclear if new.   [MB]  1356 Patient still pooling secretion in his hypopharynx and he will let us suction him.  I only see really spontaneous movement of his left arm.  Seems to have a left preferential gaze.  Still waiting on head CT but I think he will need to be intubated prior to that for airway protection.  Reviewed this with his wife and daughter.   [MB]  1537 CT done and he looks to have a large hemorrhagic bleed with shift.  Current blood pressures are better with sedation.   [MB]    Clinical Course User Index [MB] Terrilee Files, MD     Talking more with the family, granddaughter reports patient fell last night after he had been drinking and he was his normal self when he went to bed.  DDX includes upper GI bleed, CVA, aspiration pneumonia, less likely TB. Pt was responsive to painful stimuli. On arrival has coffee ground emesis.  He was able to be suctioned and was maintaining his  airway.  His oxygen was steadily up to 97% on room air. I viewed patient EKG which shows sinus rhythm and LVH.  His chest x-ray is without active disease.  There is no sign of pneumonia or suspicious pulmonary nodules or mass.  On repeat exam patient his not moving his right side.  Head CT ordered, however patient began to have difficulty maintaining airway.  Patient intubated by the attending Dr. Charm Barges, please see his procedure note.  Tube placement confirmed with portable chest x-ray.  IV Antibiotics started for pneumonia given his initial presentation of vomiting and possible aspiration.  Patient's CBC with hemoglobin of 10.5, and there is no prior to compare.  There is no leukocytosis.  His CMP shows elevated AST of 86 and mild elevation of ALT 56 he has an elevated anion gap of 19.  Rectal occult blood negative.  Needed lactic acid of 4.9, repeat 5.0.  CT head shows bilateral intraparenchymal hemorrhage with intraventricular extension with midline shift. Also right hydrocephalus, and bilateral subarachnoid hemorrhage. There is also a nondisplaced fracture of the skull vertex.  Patient currently on propofol drip with improved blood pressure control, most recent BP 129/84.  Neurosurgery came to evaluate the patient however were unable to obtain an interpreter.  Family was updated about patient's prognosis.  Neurosurgery had to leave for emergent surgery and plans to return afterwards to evaluate patient further. The patient was discussed with and seen by Dr. Charm Barges who agrees with the treatment plan.  At shift change care was transferred to [facility] who will follow pending studies, re-evaulate and determine disposition.  Neurology consult  pending.   This note was prepared with assistance of Conservation officer, historic buildings. Occasional wrong-word or sound-a-like substitutions may have occurred due to the inherent limitations of voice recognition software.   Final Clinical Impressions(s) / ED Diagnoses    Final diagnoses:  Intraparenchymal hemorrhage of brain [facility])    ED Discharge Orders    None       Kathyrn Lass 03/08/2019 1642    Terrilee Files, MD 03/13/2019 1727

## 2019-03-09 NOTE — ED Notes (Signed)
16FR OG tube measured and placed. Placement verified with second RN via auscultation.

## 2019-03-09 NOTE — ED Triage Notes (Signed)
Pt BIB [facility] for altered mental status. EMS reports friends/family reported the pt was last seen normal on 03/08/2019 @ 0500hrs. EMS advised the pt is a heavy drinker and there was copious amounts of bloody vomitis along with fecal matter near the pt on the floor. EMS advised the pt has a left sided gaze while at rest but will look at you with stimulation. EMS advised the pt was hypertensive,12 lead was unremarkable, and IV was established as noted. EMS advised the pt was non-verbal for them and would only respond to painful stimuli. EMS advised the 9-1-1 callers would be on their way to the hospital to speak with medical professionals.

## 2019-03-09 NOTE — ED Notes (Signed)
Pt continues to buck vent.

## 2019-03-09 NOTE — ED Notes (Signed)
This rn spoke with RN on 4N30 regarding contact precautions while in the ED. No orders could be found indicating precautions are needed at this time.

## 2019-03-09 NOTE — Progress Notes (Signed)
eLink Physician-Brief Progress Note Patient Name: Sayyid Trnka DOB: Apr 04, 1956 MRN: [id number]   Date of Service  02/25/2019  HPI/Events of Note  63 y/o M history of hypertension and ETOH abuse presented with altered sensorium found to have bilateral intraparenchymal hemorrhage with intraventricular extension with midline shift, right hydrocephalus and bilateral SAH.  He is now intubated. Neurosurgery consulted deemed not a surgical candidate.  eICU Interventions  Catastrophic ICH Continue vent support for airway protection BP support to maintain cerebral perfusion Goals of care will need to be discussed if not yet done Unclear why patient on droplet and contact precaution at this time but would maintain until further clarification     Intervention Category Major Interventions: Intracranial hypertension - evaluation and management Evaluation Type: New Patient Evaluation  Darl Pikes 03/13/2019, 9:26 PM

## 2019-03-09 NOTE — ED Provider Notes (Signed)
4:00 PM handoff from [place] PA-C at shift change, seen in conjunction with Dr. Charm Barges.  Patient with large intraparenchymal hemorrhage, unclear if this is related to trauma or hypertension.  CT does demonstrate skull fracture and patient had a reported fall last night.  There is been a communication barrier with the family as a speak Burmese and interpreter phone has not been working well here.  Neurosurgery consulted by previous provider.  Patient is on propofol drip after being intubated.  Latest blood pressure is 106/72 with a pulse rate of 77.  Patient with hematemesis on Protonix.  I have spoken with ICU who will see patient in emergency department.  I have also spoken with Dr. Otelia Limes who will consult on patient.    BP 106/72   Pulse 77   Temp 99.1 F (37.3 C) (Rectal)   Resp (!) 28   Ht 5\' 7"  (1.702 m)   Wt 58.7 kg   SpO2 99%   BMI 20.27 kg/m   Dr. Clarene Duke aware of patient case.     Renne Crigler, PA-C 03/18/2019 1657    Little, Ambrose Finland, MD 03/17/2019 [phone]

## 2019-03-09 NOTE — Consult Note (Addendum)
NEURO HOSPITALIST CONSULT NOTE   Requestig physician: Dr. Charm Barges  Reason for Consult:AMS  History obtained from:  Chart  HPI:                                                                                                                                          Jonathon Dennis is a 63 y.o. male with a PMHx of HTN and ETOH abuse who presented to [facility] ED with loss of consciousness.  The patient speaks Burmese.  Per chart: EMS was called by his wife when she was unable to wake him up this morning.  He was found to have had emesis and was covered in vomit as well as snoring loudly.  LSW was yesterday evening. He did drink a lot of alcohol last night. Per wife he drinks more than he eats. Denies any history of stroke or GI bleed.  He was incontinent of stool.   ED course:  CT Head: bilateral intraparenchymal hemorrhage with intraventricular extension with midline shift. Right hydrocephalus and bilateral SAH. Patient was intubated and is on propofol drip with fentanyl. Max BP 246/140. Afebrile. UDS: negative  Past Medical History:  Diagnosis Date  . Hypertension 2015    No past surgical history on file.  No family history on file.          Social History:  reports that he has been smoking cigarettes. He has a 2.00 pack-year smoking history. He has never used smokeless tobacco. He reports current alcohol use. He reports that he does not use drugs.  No Known Allergies  MEDICATIONS:                                                                                                                     Scheduled:  Continuous: . azithromycin    . cefTRIAXone (ROCEPHIN)  IV    . fentaNYL infusion INTRAVENOUS 100 mcg/hr (03/20/19 1617)  . pantoprozole (PROTONIX) infusion 8 mg/hr (03/20/2019 1452)  . propofol (DIPRIVAN) infusion 35 mcg/kg/min (2019-03-20 1545)   WUX:[id number]   ROS:  unobtainable from patient due to mental status  Blood pressure 106/72, pulse 77, temperature 99.1 F (37.3 C), temperature source Rectal, resp. rate (!) 28, height 5\' 7"  (1.702 m), weight 58.7 kg, SpO2 99 %.   General Examination:                                                                                                       Physical Exam  HEENT-  Normocephalic, no lesions, without obvious abnormality.  Normal external eye and conjunctiva.   Cardiovascular- S1-S2 audible, pulses palpable throughout   Lungs-no rhonchi or wheezing noted, no excessive working breathing.  Saturations within normal limits on RA Extremities- Warm, dry and intact Musculoskeletal-no joint tenderness, deformity or swelling Skin-warm and dry, intact  Neurological Examination on 100 mcg fentanyl and 35 propofol   Mental Status: Patient intubated and sedated. Patient does not respond to verbal stimuli.  Does not respond to deep sternal rub.  Does not follow commands.  No verbalizations noted.  Cranial Nerves: II: patient does not respond confrontation bilaterally,  III,IV,VI:pupils right 3 mm, left 3 mm,and equal bilaterally,  V,VII: corneal reflex present bilaterally  VIII: patient does not respond to verbal stimuli IX,X: gag reflex present , XI: trapezius strength unable to test bilaterally XII: tongue strength unable to test Motor: Spontaneous but not purposeful movement noted in LUE only. LLE withdraws to noxious. No movement of RUE or RLE to noxious.  Deep Tendon Reflexes:  Muted Plantars: downgoing bilaterally Cerebellar: Unable to perform     Lab Results: Basic Metabolic Panel: Recent Labs  Lab 03/24/2019 1130 Mar 24, 2019 1638  NA 134* 135  K 4.1 4.5  CL 96*  --   CO2 19*  --   GLUCOSE 163*  --   BUN 18  --   CREATININE 0.72  --   CALCIUM 9.1  --     CBC: Recent Labs  Lab 03-24-19 1130 March 24, 2019 1638  WBC 9.5  --   NEUTROABS 8.3*  --   HGB 10.5*  9.2*  HCT 33.0* 27.0*  MCV 63.0*  --   PLT 147*  --     Cardiac Enzymes: Recent Labs  Lab 03/24/19 1130  TROPONINI <0.03     Imaging: Ct Head Wo Contrast Result Date: Mar 24, 2019 IMPRESSION: 1. Bilateral intraparenchymal hemorrhage, LEFT greater than RIGHT, with intraventricular extension, 1.5 cm LEFT to RIGHT midline shift, RIGHT hydrocephalus, and bilateral subarachnoid hemorrhage,. LEFT frontoparietal intraparenchymal hemorrhage measures 5 x 9 cm and RIGHT frontal hemorrhage measures 1.4 x 3.4 cm. 2. Nondisplaced fracture of the skull vertex. Critical Value/emergent results were called by telephone at the time of interpretation on 2019-03-24 at 3:37 pm to [facility] , who verbally acknowledged these results. Electronically Signed   By: Harmon Pier M.D.   On: 03-24-2019 15:41   Dg Chest Portable 1 View Result Date: 03/24/2019 CLINICAL DATA:  ETT and OG tube placement. EXAM: PORTABLE CHEST 1 VIEW COMPARISON:  24-Mar-2019 FINDINGS: The ETT terminates in the mid trachea, in good position. The OG tube terminates below today's film. No pneumothorax.  The cardiomediastinal silhouette is normal. The lungs are clear. No other acute abnormalities. IMPRESSION: Support apparatus as above.  No acute abnormalities in the chest. Electronically Signed   By: Gerome Sam III M.D   On: 03/19/2019 14:55   Dg Chest Port 1 View  Result Date: 02/24/2019 CLINICAL DATA:  Altered mental status EXAM: PORTABLE CHEST 1 VIEW COMPARISON:  05/21/2013 FINDINGS: The cardiomediastinal silhouette is unremarkable. There is no evidence of focal airspace disease, pulmonary edema, suspicious pulmonary nodule/mass, pleural effusion, or pneumothorax. No acute bony abnormalities are identified. IMPRESSION: No active disease. Electronically Signed   By: Harmon Pier M.D.   On: 03/08/2019 12:31    Assessment:  63 y.o. male with PMH HTN, ETOH abuse who presented to [facility] ED with AMS.  ( patient speaks Burmese).CTH: large  intraparenchymal hemorrhage with intraventricular extension and midline shift.   1. Neuro surgery was also consulted by the ED. They believe that the patient is not a surgical candidate.  2. Prognosis based on CT and exam findings is most likely to be dismal.   Recommendations: 1. Hypertonic saline 2. BP management 3. Ventilator management 4. Frequent neuro checks 5. Would obtain palliative care consult 6. Head of bed to 45 degrees 7. Temperature management if he develops a fever  Valentina Lucks, MSN, NP-C [facility] [phone]  40 minutes spent in the emergent neurological evaluation and management of this critically ill patient.   I have seen and examined the patient. I have amended the documented exam with additional pertinent findings.  I provided additional counseling to patient's family via tele-interpreter, for > 50% of total consult time. I have formulated the assessment and plan.  Electronically signed: Dr. Caryl Pina 03/04/2019, 4:56 PM

## 2019-03-09 NOTE — H&P (Signed)
NAMEAlfread Srinivas, MRN:  [id number], DOB:  December 01, 1956, LOS: 0 ADMISSION DATE:  03/07/2019, CONSULTATION DATE:  03/13/2019 REFERRING MD:  EDP - Geiple, CHIEF COMPLAINT:  ICH and VDRF  Brief History   63 year old with history of etoh abuse and hypertension who presents to PCCM with a massive ICH after wife called EMS after not being able to wake up the patient.  Patient was covered in bloody vomit and snoring loudly.  Patient had consumed a lot of alcohol the night prior to presentation.  Patient was brought to the ED where he was intubated.  ED called NS and patient is not a surgical candidate.  PCCM was called to admit to the ICU.  History is obtained via remote translator.  History of present illness   63 year old with history of etoh abuse and hypertension who presents to PCCM with a massive ICH after wife called EMS after not being able to wake up the patient.  Patient was covered in bloody vomit and snoring loudly.  Patient had consumed a lot of alcohol the night prior to presentation.  Patient was brought to the ED where he was intubated.  ED called NS and patient is not a surgical candidate.  PCCM was called to admit to the ICU.  History is obtained via remote translator.  Past Medical History  Etoh abuse HTN  Significant Hospital Events   3/14 admission for ICH and GI bleed  Consults:  Neurology Neurosurgery  Procedures:  N/A  Significant Diagnostic Tests:  CT of the head 3/14 that I reviewed myself, massive ICH  Micro Data:  N/A  Antimicrobials:  N/A   Interim history/subjective:  Unresponsive  Objective   Blood pressure 106/72, pulse 77, temperature 99.1 F (37.3 C), temperature source Rectal, resp. rate (!) 28, height 5\' 7"  (1.702 m), weight 58.7 kg, SpO2 99 %.    Vent Mode: PRVC FiO2 (%):  [100 %] 100 % Set Rate:  [15 bmp] 15 bmp Vt Set:  [450 mL-520 mL] 520 mL PEEP:  [5 cmH20] 5 cmH20 Plateau Pressure:  [20 cmH20] 20 cmH20   Intake/Output Summary (Last 24  hours) at 03/15/2019 1744 Last data filed at 03/24/2019 1402 Gross per 24 hour  Intake 105.69 ml  Output -  Net 105.69 ml   Filed Weights   03/08/2019 1135  Weight: 58.7 kg    Examination: General: Chronically ill appearing male, unresponsive HENT: [place]/AT, dry blood in the mouth from vomiting, pupils are sluggish but reactive Lungs: CTA bilaterally Cardiovascular: RRR, Nl S1/S2 and -M/R/G Abdomen: Soft, NT, ND and +BS Extremities: -edema and -tenderness Neuro: Unresponsive, pupils sluggish, +gag, -EOM and -corneals Skin: Intact  Resolved Hospital Problem list   N/A  Assessment & Plan:  63 year old male with history of HTN and etoh abuse who presents to PCCM with respiratory failure a large ICH that is not operable.  Discussed with PCCM-NP  ICH:  - None operable, terminal bleed  - Check coags  - Neuro and NS following  HTN:  - Propofol enough to control for now  - No pressors if becomes hypotensive  VDRF:  - Full vent support  - ABG  - Adjust vent for ABG  - Titrate O2 for sat of 88-92%  - Will likely terminally extubate when family is ready  GOC:  - Full DNR  - Transition to comfort care when family is ready  - No escalation of care to include pressors per discussion with the  wife via interpreter  Labs   CBC: Recent Labs  Lab 03-10-19 1130 2019-03-10 1638  WBC 9.5  --   NEUTROABS 8.3*  --   HGB 10.5* 9.2*  HCT 33.0* 27.0*  MCV 63.0*  --   PLT 147*  --     Basic Metabolic Panel: Recent Labs  Lab 03-10-19 1130 Mar 10, 2019 1638  NA 134* 135  K 4.1 4.5  CL 96*  --   CO2 19*  --   GLUCOSE 163*  --   BUN 18  --   CREATININE 0.72  --   CALCIUM 9.1  --    GFR: Estimated Creatinine Clearance: 80.5 mL/min (by C-G formula based on SCr of 0.72 mg/dL). Recent Labs  Lab Mar 10, 2019 1130 03-10-19 1335  WBC 9.5  --   LATICACIDVEN 4.9* 5.0*    Liver Function Tests: Recent Labs  Lab 03-10-19 1130  AST 86*  ALT 56*  ALKPHOS 64  BILITOT 1.1  PROT 9.1*   ALBUMIN 3.6   Recent Labs  Lab 10-Mar-2019 1130  LIPASE 24   No results for input(s): AMMONIA in the last 168 hours.  ABG    Component Value Date/Time   PHART 7.462 (H) March 10, 2019 1638   PCO2ART 36.5 2019/03/10 1638   PO2ART 317.0 (H) 2019-03-10 1638   HCO3 26.1 2019/03/10 1638   TCO2 27 March 10, 2019 1638   O2SAT 100.0 10-Mar-2019 1638     Coagulation Profile: Recent Labs  Lab Mar 10, 2019 1130  INR 1.1    Cardiac Enzymes: Recent Labs  Lab Mar 10, 2019 1130  TROPONINI <0.03    HbA1C: No results found for: HGBA1C  CBG: Recent Labs  Lab 03/10/19 1110  GLUCAP 165*    Review of Systems:   Unattainable  Past Medical History  He,  has a past medical history of Hypertension (2015).   Surgical History   No past surgical history on file.   Social History   reports that he has been smoking cigarettes. He has a 2.00 pack-year smoking history. He has never used smokeless tobacco. He reports current alcohol use. He reports that he does not use drugs.   Family History   His family history is not on file.   Allergies No Known Allergies   Home Medications  Prior to Admission medications   Medication Sig Start Date End Date Taking? Authorizing Provider  metoprolol tartrate (LOPRESSOR) 25 MG tablet Take 1 tablet (25 mg total) by mouth 2 (two) times daily. Patient not taking: Reported on 03/10/2019 09/21/16   Julieanne Manson, MD  triamcinolone cream (KENALOG) 0.1 % Apply to rashy area of legs and ankles twice daily until rash resolves Patient not taking: Reported on 2019-03-10 09/21/16   Julieanne Manson, MD    The patient is critically ill with multiple organ systems failure and requires high complexity decision making for assessment and support, frequent evaluation and titration of therapies, application of advanced monitoring technologies and extensive interpretation of multiple databases.   Critical Care Time devoted to patient care services described in this note is   45  Minutes. This time reflects time of care of this signee Dr Koren Bound. This critical care time does not reflect procedure time, or teaching time or supervisory time of PA/NP/Med student/Med Resident etc but could involve care discussion time.  Alyson Reedy, M.D. [facility] Pulmonary/Critical Care Medicine. Pager: [phone]. After hours pager: [phone].

## 2019-03-09 NOTE — ED Notes (Signed)
Neurologist assessed pt LOC. Propofol temporarily stopped by this RN per MD. Painful stimuli resulted in pt raising left arm towards head. No leg movement noted on left, right side showed no activity. Propofol infusion restarted at previous rate.

## 2019-03-09 NOTE — ED Provider Notes (Signed)
Procedure Name: Intubation Date/Time: 03/04/2019 2:47 PM Performed by: Terrilee Files, MD Pre-anesthesia Checklist: Patient identified, Patient being monitored, Emergency Drugs available, Timeout performed and Suction available Oxygen Delivery Method: Non-rebreather mask Preoxygenation: Pre-oxygenation with 100% oxygen Induction Type: Rapid sequence Ventilation: Unable to mask ventilate Laryngoscope Size: Glidescope and 3 Grade View: Grade III Tube size: 7.5 mm Number of attempts: 2 Airway Equipment and Method: Video-laryngoscopy Placement Confirmation: ETT inserted through vocal cords under direct vision,  CO2 detector and Breath sounds checked- equal and bilateral Tube secured with: ETT holder Dental Injury: Bloody posterior oropharynx  Difficulty Due To: Difficult Airway- due to anterior larynx, Difficult Airway- due to limited oral opening and Difficult Airway- due to dentition     Patient had a very quick desat once paralyzed.  He was unable to be bagged easily.  Ultimately we intubated him but there was a lot of secretions in the hypopharynx.  Sats dipped to about 50% before rising to 100%.    Terrilee Files, MD 03/18/2019 1719

## 2019-03-09 NOTE — Consult Note (Signed)
Reason for Consult: Intracerebral hemorrhage Referring Physician: EDP  Jonathon Dennis is an 63 y.o. male.   HPI:  63 year old gentleman who "was drinking too much" last night and fell down some steps around 9 PM.  Family member got him into bed and "when he did not wake up this morning" he was brought to the emergency department.  CT scan shows a large left-sided intracerebral hemorrhage and a small right-sided insular hemorrhage and neurosurgical evaluation was requested.  The patient is intubated.  Cannot cooperate with history and physical.  Past Medical History:  Diagnosis Date  . Hypertension 2015    History reviewed. No pertinent surgical history.  No Known Allergies  Social History   Tobacco Use  . Smoking status: Current Every Day Smoker    Packs/day: 0.25    Years: 8.00    Pack years: 2.00    Types: Cigarettes  . Smokeless tobacco: Never Used  Substance Use Topics  . Alcohol use: Yes    Comment: drinks liquor once weekly with friends--not able to clarify, very  vague.    History reviewed. No pertinent family history.   Review of Systems  Positive ROS: Able to obtain  All other systems have been reviewed and were otherwise negative with the exception of those mentioned in the HPI and as above.  Objective: Vital signs in last 24 hours: Temp:  [97.9 F (36.6 C)-99.1 F (37.3 C)] 99.1 F (37.3 C) (03/14 1203) Pulse Rate:  [71-135] 77 (03/14 1645) Resp:  [21-40] 28 (03/14 1502) BP: (104-246)/(72-140) 106/72 (03/14 1645) SpO2:  [90 %-100 %] 99 % (03/14 1645) FiO2 (%):  [50 %-100 %] 50 % (03/14 1632) Weight:  [58.7 kg] 58.7 kg (03/14 1135)  General Appearance: Intubated male lying in a stretcher Head: Normocephalic, without obvious abnormality, atraumatic Eyes: PERRL, gaze is disconjugate    Throat: Debated Neck: Supple Lungs: Intubated, respirations unlabored Heart: Regular rate and rhythm Abdomen: Soft  NEUROLOGIC:   Mental status: Intubated, GCS 6T  [id number] Motor Exam -right hemiplegia, purposeful on the left Sensory Exam -unable to test Reflexes: Present on the left Coordination -unable to test Gait -unable to test Balance -unable to test Cranial Nerves: I: smell Not tested  II: visual acuity  OS: na    OD: na  II: visual fields Full to confrontation  II: pupils Equal, round, reactive to light  III,VII: ptosis   III,IV,VI: extraocular muscles    V: mastication   V: facial light touch sensation    V,VII: corneal reflex    VII: facial muscle function - upper    VII: facial muscle function - lower   VIII: hearing   IX: soft palate elevation    IX,X: gag reflex present  XI: trapezius strength    XI: sternocleidomastoid strength   XI: neck flexion strength    XII: tongue strength      Data Review Lab Results  Component Value Date   WBC 9.5 03/24/2019   HGB 9.2 (L) 03/05/2019   HCT 27.0 (L) 02/27/2019   MCV 63.0 (L) 03/13/2019   PLT 147 (L) 02/27/2019   Lab Results  Component Value Date   NA 135 02/28/2019   K 4.5 02/24/2019   CL 96 (L) 03/21/2019   CO2 19 (L) 03/20/2019   BUN 18 03/01/2019   CREATININE 0.72 02/28/2019   GLUCOSE 163 (H) 03/10/2019   Lab Results  Component Value Date   INR 1.1 02/24/2019    Radiology: Ct Head Wo Contrast  Result  Date: 03/25/2019 CLINICAL DATA:  63 year old male with altered mental status. EXAM: CT HEAD WITHOUT CONTRAST TECHNIQUE: Contiguous axial images were obtained from the base of the skull through the vertex without intravenous contrast. COMPARISON:  None. FINDINGS: Brain: Acute LEFT frontoparietal and RIGHT frontal intraparenchymal hemorrhage is noted. The LEFT frontoparietal intraparenchymal hemorrhage measures 5 x 9 cm, extends into the ventricular system, contributing to 1.5 cm LEFT to RIGHT midline shift and RIGHT hydrocephalus. The RIGHT frontal intraparenchymal hemorrhage measures 1.4 x 3.4 cm. Bilateral subarachnoid hemorrhage noted, RIGHT greater than LEFT. 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 Vascular: Carotid atherosclerotic calcifications noted. Skull: A nondisplaced fracture at the skull vertex is noted. Sinuses/Orbits: Mucosal thickening within paranasal sinuses noted. Opacified LEFT sphenoid sinus identified. Other: Oral intubation tube noted. IMPRESSION: 1. Bilateral intraparenchymal hemorrhage, LEFT greater than RIGHT, with intraventricular extension, 1.5 cm LEFT to RIGHT midline shift, RIGHT hydrocephalus, and bilateral subarachnoid hemorrhage,. LEFT frontoparietal intraparenchymal hemorrhage measures 5 x 9 cm and RIGHT frontal hemorrhage measures 1.4 x 3.4 cm. 2. Nondisplaced fracture of the skull vertex. Critical Value/emergent results were called by telephone at the time of interpretation on March 25, 2019 at 3:37 pm to [facility] , who verbally acknowledged these results. Electronically Signed   By: Harmon Pier M.D.   On: 2019-03-25 15:41   Dg Chest Portable 1 View  Result Date: Mar 25, 2019 CLINICAL DATA:  ETT and OG tube placement. EXAM: PORTABLE CHEST 1 VIEW COMPARISON:  2019-03-25 FINDINGS: The ETT terminates in the mid trachea, in good position. The OG tube terminates below today's film. No pneumothorax. The cardiomediastinal silhouette is normal. The lungs are clear. No other acute abnormalities. IMPRESSION: Support apparatus as above.  No acute abnormalities in the chest. Electronically Signed   By: Gerome Sam III M.D   On: 03-25-19 14:55   Dg Chest Port 1 View  Result Date: 03/25/19 CLINICAL DATA:  Altered mental status EXAM: PORTABLE CHEST 1 VIEW COMPARISON:  05/21/2013 FINDINGS: The cardiomediastinal silhouette is unremarkable. There is no evidence of focal airspace disease, pulmonary edema, suspicious pulmonary nodule/mass, pleural effusion, or pneumothorax. No acute bony abnormalities are identified. IMPRESSION: No active disease. Electronically Signed   By: Harmon Pier M.D.   On: 03-25-19 12:31      Assessment/Plan: Estimated body mass index is 20.27 kg/m as calculated from the following:   Height as of this encounter: 5\' 7"  (1.702 m).   Weight as of this encounter: 58.7 kg.   Very unfortunate 63 year old gentleman with a devastating left frontal hemorrhage with significant mass-effect.  He has bihemispheric damage.  Is probably a small stroke in the right cerebellum.  There is blood in the right sylvian fissure.  I believe this is a nonsurvivable injury.  Difficult to know whether this was a hemorrhage that led to trauma or trauma that led to hemorrhage.  While craniotomy and evacuation of the left frontoparietal hematoma may somewhat improve the chance of survival, I think that survival would have very poor quality with hemiplegia and probably aphasia.  I do not believe he would ever be independent again.  Therefore, I am not recommending surgical intervention.   Tia Alert 2019-03-25 6:31 PM

## 2019-03-10 ENCOUNTER — Inpatient Hospital Stay (HOSPITAL_COMMUNITY): Payer: Self-pay

## 2019-03-10 DIAGNOSIS — G936 Cerebral edema: Secondary | ICD-10-CM

## 2019-03-10 DIAGNOSIS — G935 Compression of brain: Secondary | ICD-10-CM

## 2019-03-10 DIAGNOSIS — W19XXXA Unspecified fall, initial encounter: Secondary | ICD-10-CM

## 2019-03-10 DIAGNOSIS — S0291XA Unspecified fracture of skull, initial encounter for closed fracture: Secondary | ICD-10-CM

## 2019-03-10 LAB — CBC
HCT: 27.3 % — ABNORMAL LOW (ref 39.0–52.0)
Hemoglobin: 8.4 g/dL — ABNORMAL LOW (ref 13.0–17.0)
MCH: 19.3 pg — ABNORMAL LOW (ref 26.0–34.0)
MCHC: 30.8 g/dL (ref 30.0–36.0)
MCV: 62.6 fL — ABNORMAL LOW (ref 80.0–100.0)
NRBC: 0.5 % — AB (ref 0.0–0.2)
Platelets: 99 10*3/uL — ABNORMAL LOW (ref 150–400)
RBC: 4.36 MIL/uL (ref 4.22–5.81)
RDW: 15.9 % — ABNORMAL HIGH (ref 11.5–15.5)
WBC: 6.4 10*3/uL (ref 4.0–10.5)

## 2019-03-10 LAB — BASIC METABOLIC PANEL
Anion gap: 9 (ref 5–15)
BUN: 27 mg/dL — ABNORMAL HIGH (ref 8–23)
CALCIUM: 8.4 mg/dL — AB (ref 8.9–10.3)
CO2: 24 mmol/L (ref 22–32)
Chloride: 105 mmol/L (ref 98–111)
Creatinine, Ser: 0.97 mg/dL (ref 0.61–1.24)
GFR calc Af Amer: >60 mL/min (ref >60)
GFR calc non Af Amer: >60 mL/min (ref >60)
Glucose, Bld: 129 mg/dL — ABNORMAL HIGH (ref 70–99)
Potassium: 4.1 mmol/L (ref 3.5–5.1)
Sodium: 138 mmol/L (ref 135–145)

## 2019-03-10 LAB — POCT I-STAT 7, (LYTES, BLD GAS, ICA,H+H)
Acid-Base Excess: 1 mmol/L (ref 0.0–2.0)
Bicarbonate: 24.8 mmol/L (ref 20.0–28.0)
Calcium, Ion: 1.18 mmol/L (ref 1.15–1.40)
HCT: 26 % — ABNORMAL LOW (ref 39.0–52.0)
Hemoglobin: 8.8 g/dL — ABNORMAL LOW (ref 13.0–17.0)
O2 Saturation: 99 %
Potassium: 3.9 mmol/L (ref 3.5–5.1)
Sodium: 141 mmol/L (ref 135–145)
TCO2: 26 mmol/L (ref 22–32)
pCO2 arterial: 37.2 mmHg (ref 32.0–48.0)
pH, Arterial: 7.433 (ref 7.350–7.450)
pO2, Arterial: 137 mmHg — ABNORMAL HIGH (ref 83.0–108.0)

## 2019-03-10 LAB — SODIUM
SODIUM: 141 mmol/L (ref 135–145)
Sodium: 139 mmol/L (ref 135–145)
Sodium: 146 mmol/L — ABNORMAL HIGH (ref 135–145)

## 2019-03-10 LAB — PHOSPHORUS: Phosphorus: 3.4 mg/dL (ref 2.5–4.6)

## 2019-03-10 LAB — ETHANOL: Alcohol, Ethyl (B): <10 mg/dL (ref <10)

## 2019-03-10 LAB — MAGNESIUM: Magnesium: 1.3 mg/dL — ABNORMAL LOW (ref 1.7–2.4)

## 2019-03-10 MED ORDER — LORAZEPAM 2 MG/ML IJ SOLN
1.0000 mg | Freq: Four times a day (QID) | INTRAMUSCULAR | Status: DC | PRN
  Administered 2019-03-11 – 2019-03-12 (×3): 1 mg via INTRAVENOUS
  Filled 2019-03-10 (×4): qty 1

## 2019-03-10 MED ORDER — LORAZEPAM 1 MG PO TABS: 1.0000 mg | ORAL_TABLET | Freq: Four times a day (QID) | ORAL | Status: DC | PRN

## 2019-03-10 MED ORDER — IOPAMIDOL (ISOVUE-370) INJECTION 76%
50.0000 mL | Freq: Once | INTRAVENOUS | Status: AC | PRN
  Administered 2019-03-10: 50 mL via INTRAVENOUS

## 2019-03-10 MED ORDER — SODIUM CHLORIDE 3 % IV SOLN
INTRAVENOUS | Status: AC
  Administered 2019-03-10 – 2019-03-11 (×3): 75 mL/h via INTRAVENOUS
  Filled 2019-03-10 (×4): qty 500

## 2019-03-10 MED ORDER — IOPAMIDOL (ISOVUE-370) INJECTION 76%
INTRAVENOUS | Status: AC
  Filled 2019-03-10: qty 50

## 2019-03-10 MED ORDER — CLEVIDIPINE BUTYRATE 0.5 MG/ML IV EMUL
0.0000 mg/h | INTRAVENOUS | Status: DC
  Administered 2019-03-10: 1 mg/h via INTRAVENOUS
  Administered 2019-03-11: 8 mg/h via INTRAVENOUS
  Administered 2019-03-11: 1 mg/h via INTRAVENOUS
  Administered 2019-03-11: 10 mg/h via INTRAVENOUS
  Administered 2019-03-12: 6 mg/h via INTRAVENOUS
  Administered 2019-03-12: 12 mg/h via INTRAVENOUS
  Administered 2019-03-13: 1 mg/h via INTRAVENOUS
  Administered 2019-03-14: 2 mg/h via INTRAVENOUS
  Filled 2019-03-10 (×9): qty 50

## 2019-03-10 MED ORDER — FOLIC ACID 1 MG PO TABS
1.0000 mg | ORAL_TABLET | Freq: Every day | ORAL | Status: DC
  Administered 2019-03-10 – 2019-03-14 (×5): 1 mg via ORAL
  Filled 2019-03-10 (×5): qty 1

## 2019-03-10 MED ORDER — ADULT MULTIVITAMIN W/MINERALS CH
1.0000 | ORAL_TABLET | Freq: Every day | ORAL | Status: DC
  Administered 2019-03-10 – 2019-03-14 (×5): 1 via ORAL
  Filled 2019-03-10 (×5): qty 1

## 2019-03-10 MED ORDER — THIAMINE HCL 100 MG/ML IJ SOLN
100.0000 mg | Freq: Every day | INTRAMUSCULAR | Status: DC
  Administered 2019-03-11: 100 mg via INTRAVENOUS
  Filled 2019-03-10: qty 2

## 2019-03-10 MED ORDER — VITAMIN B-1 100 MG PO TABS
100.0000 mg | ORAL_TABLET | Freq: Every day | ORAL | Status: DC
  Administered 2019-03-10 – 2019-03-14 (×4): 100 mg via ORAL
  Filled 2019-03-10 (×4): qty 1

## 2019-03-10 NOTE — Progress Notes (Signed)
[name], MRN:  [id number], DOB:  09-Sep-1956, LOS: 1 ADMISSION DATE:  03/08/2019, CONSULTATION DATE:  03/16/2019 REFERRING MD:  EDP - Geiple, CHIEF COMPLAINT:  ICH and VDRF  Brief History   63 year old with history of etoh abuse and hypertension who presents to PCCM with a massive ICH after wife called EMS after not being able to wake up the patient.  Patient was covered in bloody vomit and snoring loudly.  Patient had consumed a lot of alcohol the night prior to presentation.  Patient was brought to the ED where he was intubated.  ED called NS and patient is not a surgical candidate.  PCCM was called to admit to the ICU.  History is obtained via remote translator.   Past Medical History  Etoh abuse HTN  Significant Hospital Events   3/14 admission for ICH and GI bleed, not surgical candidate  3/15 localizing and tremulous w/ agitation. Sedated  Consults:  Neurology Neurosurgery  Procedures:  N/A  Significant Diagnostic Tests:  CT of the head 3/14 that I reviewed myself, massive ICH  Micro Data:  N/A  Antimicrobials:  N/A   Interim history/subjective:  Briskly localizes   Objective   Blood pressure 119/68, pulse 66, temperature 98.7 F (37.1 C), temperature source Axillary, resp. rate 15, height 5\' 7"  (1.702 m), weight 59.3 kg, SpO2 99 %.    Vent Mode: PRVC FiO2 (%):  [40 %-100 %] 40 % Set Rate:  [15 bmp] 15 bmp Vt Set:  [450 mL-520 mL] 520 mL PEEP:  [5 cmH20] 5 cmH20 Plateau Pressure:  [17 cmH20-22 cmH20] 22 cmH20   Intake/Output Summary (Last 24 hours) at 03/10/2019 1127 Last data filed at 03/10/2019 0700 Gross per 24 hour  Intake 2142.97 ml  Output 500 ml  Net 1642.97 ml   Filed Weights   03/13/2019 1135 03/10/19 0345  Weight: 58.7 kg 59.3 kg    Examination:  General: 63 year old male. Resting in bed. Sedated Neuro: agitated w/ stimulation briskly localizes w/ left. Right sided hemiparesis Pulm: clear no accessory use  Card RRR no MRG abd Not  tender + bowel sounds GU clear yellow  Ext brisk CR no edema   Resolved Hospital Problem list   N/A  Assessment & Plan:  63 year old male with history of HTN and etoh abuse who presents to PCCM with respiratory failure a large ICH that is not operable.  Discussed with PCCM-NP  Acute Encephalopathy 2/2 ICH also at risk for w/d -not operable  Plan Cont HT saline protocol for edema BP control (<140) Serial neuro checks Await family arrival. Need to discuss goals of care Add thiamine and folate Propofol and felt for RASS goal -2  HTN Plan Cont protofol   Acute respiratory failure w/ possible aspiration (no infiltrate) Plan Cont full vent support VAP bundle Plan for one-way extubation at some point  Dc abx  UGIB w/ associated Anemia ->? ETOH related gastritis?  ->not candidate for EGD Plan Cont PPI gtt Trend CBC No transfusion  GOC:  - Full DNR  - Transition to comfort care when family is ready  - No escalation of care to include pressors per discussion with the wife via interpreter (per Dr Arther Abbott practice    DVT: SCD Diet: NPO SUP: PPI gtt VAP: 3/14 Sedation: 3/14 Dispo: Remains critically ill d/t devastating traumatic ICH. Awaiting family arrival. Plan for one-way extubation  cct 34 min  Simonne Martinet ACNP-BC [facility] Pulmonary/Critical Care  Pager # [phone] OR # [phone] if no answer

## 2019-03-10 NOTE — Progress Notes (Signed)
Spoke with on-call Dr. Wilford Corner regarding BP parameters and Na levels. New orders given for BP management and will keep 3% NS infusing at 75 ml/hr at this time.

## 2019-03-10 NOTE — Progress Notes (Signed)
Palliative:  Consult received and chart reviewed.  Discussed with nurse - family has left for the day. Wife is not english speaking. They have been using tele-interpreter for communication. Nurse tells me patient's wife wants her sister (coming from [place] and will be here Tuesday) involved in goals of care discussion. PMT will f/u tomorrow to schedule GOC meeting with patient's wife - sounds like will likely be Tuesday.  Thank you for this consult.  Gerlean Ren, DNP, AGNP-C Palliative Medicine Team Team Phone # [phone]  Pager # [phone]

## 2019-03-10 NOTE — Progress Notes (Signed)
STROKE TEAM PROGRESS NOTE   HISTORY OF PRESENT ILLNESS (per record) Jonathon Dennis is a 63 y.o. male with a PMHx of HTN and ETOH abuse who presented to [facility] ED with loss of consciousness. The patient speaks Burmese.  Per chart: EMS was called by his wife when she was unable to wake him up this morning.  He was found to have had emesis and was covered in vomit as well as snoring loudly.  LSW was yesterday evening. He did drink a lot of alcohol last night. Per wife he drinks more than he eats. Denies any history of stroke or GI bleed. He was incontinent of stool.   ED course:  CT Head: bilateral intraparenchymal hemorrhage with intraventricular extension with midline shift. Right hydrocephalus and bilateral SAH. Patient was intubated and is on propofol drip with fentanyl. Max BP 246/140. Afebrile. UDS: negative   SUBJECTIVE (INTERVAL HISTORY) No family members present. The pt is intubated. Responds to pain but not following commands. Nursing suggested CIWA protocol due to patients alcohol history.    OBJECTIVE Vitals:   03/10/19 0600 03/10/19 0700 03/10/19 0748 03/10/19 0800  BP: 103/69 107/71  119/68  Pulse: 61 63  66  Resp: 15 15  15   Temp:    98.7 F (37.1 C)  TempSrc:    Axillary  SpO2: 100% 100% 99% 99%  Weight:      Height:        CBC:  Recent Labs  Lab 03/25/2019 1130  03/10/19 0304 03/10/19 0422  WBC 9.5  --  6.4  --   NEUTROABS 8.3*  --   --   --   HGB 10.5*   < > 8.4* 8.8*  HCT 33.0*   < > 27.3* 26.0*  MCV 63.0*  --  62.6*  --   PLT 147*  --  99*  --    < > = values in this interval not displayed.    Basic Metabolic Panel:  Recent Labs  Lab 03/24/2019 1130  03/10/19 0304 03/10/19 0422 03/10/19 0622  NA 134*   < > 138 141 141  K 4.1   < > 4.1 3.9  --   CL 96*  --  105  --   --   CO2 19*  --  24  --   --   GLUCOSE 163*  --  129*  --   --   BUN 18  --  27*  --   --   CREATININE 0.72  --  0.97  --   --   CALCIUM 9.1  --  8.4*  --   --   MG  --   --  1.3*  --    --   PHOS  --   --  3.4  --   --    < > = values in this interval not displayed.    Lipid Panel: No results found for: CHOL, TRIG, HDL, CHOLHDL, VLDL, LDLCALC HgbA1c: No results found for: HGBA1C Urine Drug Screen:     Component Value Date/Time   LABOPIA NONE DETECTED 02/24/2019 1205   COCAINSCRNUR NONE DETECTED 03/01/2019 1205   LABBENZ NONE DETECTED 03/23/2019 1205   AMPHETMU NONE DETECTED 03/13/2019 1205   THCU NONE DETECTED 03/24/2019 1205   LABBARB NONE DETECTED 03/13/2019 1205    Alcohol Level     Component Value Date/Time   ETH <10 03/22/2019 2040    IMAGING   Ct Head Wo Contrast 03/01/2019 IMPRESSION:  1. Bilateral intraparenchymal  hemorrhage, LEFT greater than RIGHT, with intraventricular extension, 1.5 cm LEFT to RIGHT midline shift, RIGHT hydrocephalus, and bilateral subarachnoid hemorrhage,. LEFT frontoparietal intraparenchymal hemorrhage measures 5 x 9 cm and RIGHT frontal hemorrhage measures 1.4 x 3.4 cm.  2. Nondisplaced fracture of the skull vertex.    Dg Chest Port 1 View 03/10/2019 IMPRESSION:  Endotracheal tube and nasogastric tube well positioned. Lungs remain well aerated.    Dg Chest Portable 1 View 03/21/2019 IMPRESSION:  Support apparatus as above.  No acute abnormalities in the chest.     Dg Chest Port 1 View 03/21/2019 IMPRESSION:  No active disease.    Dg Abd Portable 1v 03/10/2019 IMPRESSION:  Orogastric tube tip in the gastric antrum.     EKG - SR rate 71 BPM. (See cardiology reading for complete details)    PHYSICAL EXAM Blood pressure 119/68, pulse 66, temperature 98.7 F (37.1 C), temperature source Axillary, resp. rate 15, height 5\' 7"  (1.702 m), weight 59.3 kg, SpO2 99 %. Frail middle-aged Asian male who is sedated and intubated. Not in distress. . Afebrile. Head is nontraumatic. Neck is supple without bruit.    Cardiac exam no murmur or gallop. Lungs are clear to auscultation. Distal pulses are well felt.  Neurological  Exam : Sedated intubated. Eyes are closed. Responds to sternal rub but partially opening eyes. Eyes are in primary position. Pupils 3 mm equal reactive. Doll's movements are sluggish. Fundi not visualized. Mild right lower facial weakness. Tongue midline. motor system exam purposeful movements in the left upper and lower extremity. Trace withdrawal in the right upper extremity. Mild withdrawal in the right lower extremity    ASSESSMENT/PLAN [name] is a 63 y.o. male with history of Htn, tobacco use and alcohol abuse presenting after being found unrespnsive. He did not receive IV t-PA due to hemprrhage  Bilateral intraparenchymal hemorrhage with a nondisplaced fracture of the skull vertex.etiology likely traumatic and alcohol-related. Doubt hypertensive  Resultant aphasia right hemiplegia  CT head - Bilateral intraparenchymal hemorrhage with a nondisplaced fracture of the skull vertex.  MRI head - not performed  MRA head - not performed  CTA H&N - not performed  Carotid Doppler - not indicated  2D Echo - not indicated  LDL - not performed  HgbA1c - not performed  UDS - negative  VTE prophylaxis - SCDs  Diet - NPO  No antithrombotic prior to admission, now on No antithrombotic  Ongoing aggressive stroke risk factor management  Therapy recommendations:  pending  Disposition:  Pending  Hypertension  Stable . Long-term BP goal normotensive  Hyperlipidemia  Lipid lowering medication PTA:  none  LDL - not indicated, goal < 70  Current lipid lowering medication: none  Continue statin at discharge  Other Stroke Risk Factors  Advanced age  Cigarette smoker - will be advised to stop smoking  ETOH use, advised to drink no more than 1 alcoholic beverage per day.   Other   NS consult - surgery not recommended  ETOH hx -> CIWA protocol  Hypertonic saline increased to 75 cc / hr  Anemia - Hb 10.5-> 8.4-> 8.8  Thrombocytopenia - 147->99  Na  134->138->141->141  Mg - 1.3  Dr Jonathon Dennis to meet with family when they arrive.  Consider repeat Head CT   Hospital day # 1  Delton See PA-C [facility] Pager [phone] 03/10/2019, 11:43 AM I have personally obtained history,examined this patient, reviewed notes, independently viewed imaging studies, participated in medical decision making and plan  of care.ROS completed by me personally and pertinent positives fully documented  I have made any additions or clarifications directly to the above note. Agree with note above. He presented with a fall at home while intoxicated and presented with altered mental status and right hemiplegia with bowed significantly limited blood pressure. CT scan shows large left frontal and smaller right frontal hemorrhage with some mass effect. Etiology likely posttraumatic or alcohol-related rather than hypertensive hemorrhage. Continue strict blood pressure control and close neurological monitoring and supportive care. Long discussion with the patient's wife using her 81 year old granddaughter as interpreter about prognosis and evaluation and treatment and answered questions. I recommend a family meeting tomorrow morning with a Burmese language interpreter at the bedside to discuss goals of care and make further treatment decisions. Discussed with critical care team PA , Anders Simmonds This patient is critically ill and at significant risk of neurological worsening, death and care requires constant monitoring of vital signs, hemodynamics,respiratory and cardiac monitoring, extensive review of multiple databases, frequent neurological assessment, discussion with family, other specialists and medical decision making of high complexity.I have made any additions or clarifications directly to the above note.This critical care time does not reflect procedure time, or teaching time or supervisory time of PA/NP/Med Resident etc but could involve care discussion  time.  I spent 30 minutes of neurocritical care time  in the care of  this patient.     Delia Heady, MD Medical Director [facility] Stroke Center Pager: [phone] 03/10/2019 12:34 PM   To contact Stroke Continuity provider, please refer to [email or web address]. After hours, contact General Neurology

## 2019-03-10 NOTE — Progress Notes (Signed)
eLink Physician-Brief Progress Note Patient Name: Jonathon Dennis DOB: [birth year] MRN: [id number]   Date of Service  03/10/2019  HPI/Events of Note  Notified of minimal urine output. Bladder scan 300 cc.  eICU Interventions  Ordered in and out cath if bladder scan with >400 cc and with minimal urine output     Intervention Category Intermediate Interventions: Oliguria - evaluation and management  Rosalie Gums Atonya Templer 03/10/2019, 2:08 AM

## 2019-03-11 DIAGNOSIS — G936 Cerebral edema: Secondary | ICD-10-CM

## 2019-03-11 DIAGNOSIS — I611 Nontraumatic intracerebral hemorrhage in hemisphere, cortical: Secondary | ICD-10-CM

## 2019-03-11 LAB — COMPREHENSIVE METABOLIC PANEL
ALT: 32 U/L (ref 0–44)
AST: 54 U/L — ABNORMAL HIGH (ref 15–41)
Albumin: 2.8 g/dL — ABNORMAL LOW (ref 3.5–5.0)
Alkaline Phosphatase: 46 U/L (ref 38–126)
Anion gap: 5 (ref 5–15)
BUN: 12 mg/dL (ref 8–23)
CO2: 23 mmol/L (ref 22–32)
Calcium: 8.4 mg/dL — ABNORMAL LOW (ref 8.9–10.3)
Chloride: 121 mmol/L — ABNORMAL HIGH (ref 98–111)
Creatinine, Ser: 0.77 mg/dL (ref 0.61–1.24)
GFR calc Af Amer: >60 mL/min (ref >60)
GFR calc non Af Amer: >60 mL/min (ref >60)
Glucose, Bld: 134 mg/dL — ABNORMAL HIGH (ref 70–99)
Potassium: 3.2 mmol/L — ABNORMAL LOW (ref 3.5–5.1)
SODIUM: 149 mmol/L — AB (ref 135–145)
Total Bilirubin: 1.6 mg/dL — ABNORMAL HIGH (ref 0.3–1.2)
Total Protein: 7.5 g/dL (ref 6.5–8.1)

## 2019-03-11 LAB — SODIUM
Sodium: 143 mmol/L (ref 135–145)
Sodium: 151 mmol/L — ABNORMAL HIGH (ref 135–145)
Sodium: 156 mmol/L — ABNORMAL HIGH (ref 135–145)

## 2019-03-11 LAB — CBC
HCT: 27.3 % — ABNORMAL LOW (ref 39.0–52.0)
Hemoglobin: 8.7 g/dL — ABNORMAL LOW (ref 13.0–17.0)
MCH: 20.6 pg — ABNORMAL LOW (ref 26.0–34.0)
MCHC: 31.9 g/dL (ref 30.0–36.0)
MCV: 64.7 fL — ABNORMAL LOW (ref 80.0–100.0)
Platelets: 89 10*3/uL — ABNORMAL LOW (ref 150–400)
RBC: 4.22 MIL/uL (ref 4.22–5.81)
RDW: 17 % — ABNORMAL HIGH (ref 11.5–15.5)
WBC: 7.1 10*3/uL (ref 4.0–10.5)
nRBC: 0.6 % — ABNORMAL HIGH (ref 0.0–0.2)

## 2019-03-11 LAB — TRIGLYCERIDES: Triglycerides: 169 mg/dL — ABNORMAL HIGH (ref <150)

## 2019-03-11 MED ORDER — POTASSIUM CHLORIDE 20 MEQ/15ML (10%) PO SOLN
40.0000 meq | Freq: Once | ORAL | Status: AC
  Administered 2019-03-11: 40 meq
  Filled 2019-03-11: qty 30

## 2019-03-11 NOTE — Progress Notes (Signed)
STROKE TEAM PROGRESS NOTE    SUBJECTIVE (INTERVAL HISTORY) No family members present. The pt is intubated. Responds to pain but not following commands. Nursing suggested CIWA protocol due to patients alcohol history.he continues to have intermittent tremors when he is stimulated. He is on Cleviprex for blood pressure control which was started last night. He remains on propofol for sedation    OBJECTIVE Vitals:   03/11/19 1345 03/11/19 1400 03/11/19 1415 03/11/19 1430  BP: (!) 153/98 136/88 140/82 137/78  Pulse: (!) 101 (!) 103 (!) 101 100  Resp: 15 15 15 15   Temp:      TempSrc:      SpO2: 98% 98% 99% 98%  Weight:      Height:        CBC:  Recent Labs  Lab 03/31/19 1130  03/10/19 0304 03/10/19 0422 03/11/19 0522  WBC 9.5  --  6.4  --  7.1  NEUTROABS 8.3*  --   --   --   --   HGB 10.5*   < > 8.4* 8.8* 8.7*  HCT 33.0*   < > 27.3* 26.0* 27.3*  MCV 63.0*  --  62.6*  --  64.7*  PLT 147*  --  99*  --  89*   < > = values in this interval not displayed.    Basic Metabolic Panel:  Recent Labs  Lab 03/10/19 0304 03/10/19 0422  03/11/19 0522 03/11/19 1140  NA 138 141   < > 149* 151*  K 4.1 3.9  --  3.2*  --   CL 105  --   --  121*  --   CO2 24  --   --  23  --   GLUCOSE 129*  --   --  134*  --   BUN 27*  --   --  12  --   CREATININE 0.97  --   --  0.77  --   CALCIUM 8.4*  --   --  8.4*  --   MG 1.3*  --   --   --   --   PHOS 3.4  --   --   --   --    < > = values in this interval not displayed.    Lipid Panel:     Component Value Date/Time   TRIG 169 (H) 03/11/2019 0522   HgbA1c: No results found for: HGBA1C Urine Drug Screen:     Component Value Date/Time   LABOPIA NONE DETECTED Mar 31, 2019 1205   COCAINSCRNUR NONE DETECTED Mar 31, 2019 1205   LABBENZ NONE DETECTED 2019-03-31 1205   AMPHETMU NONE DETECTED Mar 31, 2019 1205   THCU NONE DETECTED 2019/03/31 1205   LABBARB NONE DETECTED 2019-03-31 1205    Alcohol Level     Component Value Date/Time   ETH <10  03/10/2019 0951    IMAGING   Ct Head Wo Contrast March 31, 2019 IMPRESSION:  1. Bilateral intraparenchymal hemorrhage, LEFT greater than RIGHT, with intraventricular extension, 1.5 cm LEFT to RIGHT midline shift, RIGHT hydrocephalus, and bilateral subarachnoid hemorrhage,. LEFT frontoparietal intraparenchymal hemorrhage measures 5 x 9 cm and RIGHT frontal hemorrhage measures 1.4 x 3.4 cm.  2. Nondisplaced fracture of the skull vertex.    Dg Chest Port 1 View 03/10/2019 IMPRESSION:  Endotracheal tube and nasogastric tube well positioned. Lungs remain well aerated.    Dg Chest Portable 1 View 03/31/19 IMPRESSION:  Support apparatus as above.  No acute abnormalities in the chest.     Dg Chest [facility]  02/24/2019 IMPRESSION:  No active disease.    Dg Abd Portable 1v 03/10/2019 IMPRESSION:  Orogastric tube tip in the gastric antrum.     EKG - SR rate 71 BPM. (See cardiology reading for complete details)    PHYSICAL EXAM Blood pressure 137/78, pulse 100, temperature 100.3 F (37.9 C), temperature source Axillary, resp. rate 15, height  (1.702 m), weight 59.3 kg, SpO2 98 %. Frail middle-aged Asian male who is sedated and intubated. Not in distress. . Afebrile. Head is nontraumatic. Neck is supple without bruit.    Cardiac exam no murmur or gallop. Lungs are clear to auscultation. Distal pulses are well felt.  Neurological Exam : Sedated intubated. Eyes are closed. Responds to sternal rub but partially opening eyes. Eyes are in primary position. Pupils 3 mm equal reactive. Doll's movements are sluggish. Fundi not visualized. Mild right lower facial weakness. Tongue midline. motor system exam purposeful movements in the left upper and lower extremity. Trace withdrawal in the right upper extremity. Mild withdrawal in the right lower extremity.intermittent tremors of the upper extremities on stimulation right greater than left.    ASSESSMENT/PLAN [name] is a 63 y.o.  male with history of Htn, tobacco use and alcohol abuse presenting after being found unrespnsive. He did not receive IV t-PA due to hemprrhage  Bilateral intraparenchymal hemorrhage with a nondisplaced fracture of the skull vertex.etiology likely traumatic and alcohol-related. Doubt hypertensive  Resultant aphasia right hemiplegia  CT head - Bilateral intraparenchymal hemorrhage with a nondisplaced fracture of the skull vertex.  MRI head - not performed  MRA head - not performed  CTA H&N - not performed  Carotid Doppler - not indicated  2D Echo - not indicated  LDL - not performed  HgbA1c - not performed  UDS - negative  VTE prophylaxis - SCDs  Diet - NPO  No antithrombotic prior to admission, now on No antithrombotic  Ongoing aggressive stroke risk factor management  Therapy recommendations:  pending  Disposition:  Pending  Hypertension  Stable . Long-term BP goal normotensive  Hyperlipidemia  Lipid lowering medication PTA:  none  LDL - not indicated, goal < 70  Current lipid lowering medication: none  Continue statin at discharge  Other Stroke Risk Factors  Advanced age  Cigarette smoker - will be advised to stop smoking  ETOH use, advised to drink no more than 1 alcoholic beverage per day.   Other   NS consult - surgery not recommended  ETOH hx -> CIWA protocol  Hypertonic saline increased to 75 cc / hr  Anemia - Hb 10.5-> 8.4-> 8.8  Thrombocytopenia - 147->99  Na 134->138->141->141  Mg - 1.3  Dr Pearlean Brownie to meet with family when they arrive.  Consider repeat Head CT   Hospital day # 2   He presented with a fall at home while intoxicated and presented with altered mental status and right hemiplegia with bowed significantly limited blood pressure. CT scan shows large left frontal and smaller right frontal hemorrhage with some mass effect. Etiology likely posttraumatic or alcohol-related rather than hypertensive hemorrhage. Continue  strict blood pressure control and close neurological monitoring and supportive care.Wean cleviprex drip and change systolic blood pressure goal to below 160.  No family at the bedside. I recommend a family meeting   with a Burmese language interpreter at the bedside to discuss goals of care and make further treatment decisions. Discussed with critical care team   This patient is critically ill and at significant risk of neurological  worsening, death and care requires constant monitoring of vital signs, hemodynamics,respiratory and cardiac monitoring, extensive review of multiple databases, frequent neurological assessment, discussion with family, other specialists and medical decision making of high complexity.I have made any additions or clarifications directly to the above note.This critical care time does not reflect procedure time, or teaching time or supervisory time of PA/NP/Med Resident etc but could involve care discussion time.  I spent 30 minutes of neurocritical care time  in the care of  this patient.     Delia Heady, MD Medical Director [facility] Stroke Center Pager: [phone] 03/11/2019 2:47 PM   To contact Stroke Continuity provider, please refer to [email or web address]. After hours, contact General Neurology

## 2019-03-11 NOTE — Progress Notes (Signed)
NAMEJaquari Dennis, MRN:  [id number], DOB:  1956/12/16, LOS: 2 ADMISSION DATE:  03-27-19, CONSULTATION DATE:  March 27, 2019 REFERRING MD:  EDP - [place], CHIEF COMPLAINT:  ICH and VDRF  Brief History   63 year old with history of etoh abuse and hypertension who presents to PCCM with a massive ICH after wife called EMS after not being able to wake up the patient.  Patient was covered in bloody vomit and snoring loudly.  Patient had consumed a lot of alcohol the night prior to presentation.  Patient was brought to the ED where he was intubated.  ED called NS and patient is not a surgical candidate.  PCCM was called to admit to the ICU.  History is obtained via remote translator.  Past Medical History  Etoh abuse HTN  Significant Hospital Events   3/14 admission for ICH and GI bleed, not surgical candidate  3/15 localizing and tremulous w/ agitation. Sedated   Consults:  Neurology Neurosurgery  Procedures:  N/A  Significant Diagnostic Tests:  CT of the head 3/14 that I reviewed myself, massive ICH CTA head 3/15 > negative for aneurysm or large vessel occlusion, no vascular malformations.  Probable vasospasm of the left MCA branches.  Stable SAH and IVH.  Small volume para falcine SDH is increased.  Micro Data:  N/A  Antimicrobials:  N/A   Interim history/subjective:  No acute events.  Has slight tremor with stimulation, otherwise nonresponsive.  Objective   Blood pressure (!) 143/80, pulse (!) 110, temperature 100.3 F (37.9 C), temperature source Axillary, resp. rate 15, height 5\' 7"  (1.702 m), weight 59.3 kg, SpO2 97 %.    Vent Mode: PRVC FiO2 (%):  [40 %-50 %] 50 % Set Rate:  [15 bmp] 15 bmp Vt Set:  [520 mL] 520 mL PEEP:  [5 cmH20] 5 cmH20 Plateau Pressure:  [18 cmH20-23 cmH20] 19 cmH20   Intake/Output Summary (Last 24 hours) at 03/11/2019 1044 Last data filed at 03/11/2019 4970 Gross per 24 hour  Intake 3157.58 ml  Output 3250 ml  Net -92.42 ml   Filed Weights   27-Mar-2019 1135 03/10/19 0345  Weight: 58.7 kg 59.3 kg    Examination: General: 64 year old male, in NAD Neuro: Tremor with stimulation, otherwise non-responsive.  Requiring sedation due to tachycardia Pulm CTAB Card RRR no MRG GI  BS x 4, S/NT/ND GU clear yellow  Ext brisk CR no edema   Assessment & Plan:  63 year old male with history of HTN and etoh abuse who presents to PCCM with respiratory failure a large ICH that is not operable.  Discussed with PCCM-NP  Acute Encephalopathy 2/2 inoperable ICH  At risk for w/d  Plan Cont HT saline protocol for edema, continue via PIV and no plans for PICC / CVL given probable withdrawal 3/17 Await family arrival. Need to discuss goals of care - per RN, most should arrive tonight 3/16 or by AM 3/17.  Palliative is following Continue thiamine and folate Propofol and fentanyl for RASS goal -2  HTN Plan Cont cleviprex (almost off), goal SBP < 160  Acute respiratory failure w/ possible aspiration (no infiltrate) Plan Cont full vent support VAP bundle Plan for one-way extubation probably 3/17  UGIB w/ associated Anemia ->? ETOH related gastritis?  ->not candidate for EGD Plan Cont PPI gtt for now Trend CBC No transfusion  Hypokalemia Plan 40 mEq K per tube Follow BMP  GOC: - Full DNR - Transition to comfort care when family is ready,  probably 3/17 - No escalation of care to include pressors per discussion with the wife via interpreter (per Dr Arther Abbott practice    DVT: SCD Diet: NPO SUP: PPI gtt VAP: in place Sedation: propofol  / fentanyl.  RASS goal -2. Dispo: Remains critically ill d/t devastating traumatic ICH. Awaiting family arrival. Plan for one-way extubation, likely Tuesday 3/17.  CC time: 30 min.   Rutherford Guys, PA [facility] Pager: [phone].  If no answer, [phone] 03/11/2019, 10:52 AM

## 2019-03-11 NOTE — Progress Notes (Signed)
Nutrition Brief Note  Chart reviewed. Per MD plan to transition to comfort care once extended family arrives.   No nutrition interventions warranted at this time.  Please consult as needed.   Kendell Bane RD, LDN, CNSC [phone] Pager [phone] After Hours Pager

## 2019-03-11 NOTE — Progress Notes (Signed)
Palliative:  Checked in - no family at bedside. Unable to call family to schedule family meeting d/t language barrier. Per nursing report, family to arrive tomorrow. Will plan to discuss GOC tomorrow.  Gerlean Ren, DNP, AGNP-C Palliative Medicine Team Team Phone # [phone]  Pager # [phone]  NO CHARGE

## 2019-03-12 DIAGNOSIS — G935 Compression of brain: Secondary | ICD-10-CM

## 2019-03-12 LAB — CBC
HCT: 31.3 % — ABNORMAL LOW (ref 39.0–52.0)
Hemoglobin: 9.1 g/dL — ABNORMAL LOW (ref 13.0–17.0)
MCH: 19.7 pg — ABNORMAL LOW (ref 26.0–34.0)
MCHC: 29.1 g/dL — ABNORMAL LOW (ref 30.0–36.0)
MCV: 67.6 fL — ABNORMAL LOW (ref 80.0–100.0)
Platelets: 103 10*3/uL — ABNORMAL LOW (ref 150–400)
RBC: 4.63 MIL/uL (ref 4.22–5.81)
RDW: 18 % — AB (ref 11.5–15.5)
WBC: 2.7 10*3/uL — ABNORMAL LOW (ref 4.0–10.5)
nRBC: 1.1 % — ABNORMAL HIGH (ref 0.0–0.2)

## 2019-03-12 LAB — BASIC METABOLIC PANEL
BUN: 13 mg/dL (ref 8–23)
CO2: 22 mmol/L (ref 22–32)
Calcium: 8.6 mg/dL — ABNORMAL LOW (ref 8.9–10.3)
Chloride: >130 mmol/L (ref 98–111)
Creatinine, Ser: 0.94 mg/dL (ref 0.61–1.24)
GFR calc Af Amer: >60 mL/min (ref >60)
GFR calc non Af Amer: >60 mL/min (ref >60)
Glucose, Bld: 136 mg/dL — ABNORMAL HIGH (ref 70–99)
Potassium: 3.2 mmol/L — ABNORMAL LOW (ref 3.5–5.1)
Sodium: 160 mmol/L — ABNORMAL HIGH (ref 135–145)

## 2019-03-12 LAB — SODIUM
Sodium: 158 mmol/L — ABNORMAL HIGH (ref 135–145)
Sodium: 161 mmol/L (ref 135–145)
Sodium: 161 mmol/L (ref 135–145)

## 2019-03-12 LAB — PHOSPHORUS: Phosphorus: 3.5 mg/dL (ref 2.5–4.6)

## 2019-03-12 LAB — MAGNESIUM: Magnesium: 1.6 mg/dL — ABNORMAL LOW (ref 1.7–2.4)

## 2019-03-12 MED ORDER — PANTOPRAZOLE SODIUM 40 MG IV SOLR
40.0000 mg | Freq: Two times a day (BID) | INTRAVENOUS | Status: DC
  Administered 2019-03-12 – 2019-03-14 (×5): 40 mg via INTRAVENOUS
  Filled 2019-03-12 (×5): qty 40

## 2019-03-12 MED ORDER — MAGNESIUM SULFATE 2 GM/50ML IV SOLN
2.0000 g | Freq: Once | INTRAVENOUS | Status: AC
  Administered 2019-03-12: 2 g via INTRAVENOUS
  Filled 2019-03-12: qty 50

## 2019-03-12 MED ORDER — SODIUM CHLORIDE 0.9 % IV SOLN
INTRAVENOUS | Status: DC | PRN
  Administered 2019-03-12: 10:00:00 via INTRAVENOUS

## 2019-03-12 MED ORDER — POTASSIUM CHLORIDE 20 MEQ/15ML (10%) PO SOLN
40.0000 meq | Freq: Once | ORAL | Status: AC
  Administered 2019-03-12: 40 meq
  Filled 2019-03-12: qty 30

## 2019-03-12 NOTE — Progress Notes (Signed)
STROKE TEAM PROGRESS NOTE    SUBJECTIVE (INTERVAL HISTORY) His wife, his daughter from [place] and granddaughter are present. The pt is intubated. The patient's condition appears to have declined and is less responsive now. Is on sedation. Family had agreed to DO NOT RESUSCITATE. Patient is on hypertonic saline drip and serum sodium is above goal   OBJECTIVE Vitals:   03/12/19 1000 03/12/19 1100 03/12/19 1137 03/12/19 1200  BP: 140/80 118/78 118/78 (!) 153/85  Pulse: (!) 103 (!) 103 97 (!) 106  Resp: 15 15 16 17   Temp:    100 F (37.8 C)  TempSrc:    Rectal  SpO2: 100% 99% 97% 97%  Weight:      Height:        CBC:  Recent Labs  Lab 03/16/2019 1130  03/11/19 0522 03/12/19 0613  WBC 9.5   < > 7.1 2.7*  NEUTROABS 8.3*  --   --   --   HGB 10.5*   < > 8.7* 9.1*  HCT 33.0*   < > 27.3* 31.3*  MCV 63.0*   < > 64.7* 67.6*  PLT 147*   < > 89* 103*   < > = values in this interval not displayed.    Basic Metabolic Panel:  Recent Labs  Lab 03/10/19 0304  03/11/19 0522  03/12/19 0613 03/12/19 1146  NA 138   < > 149*   < > 160* 161*  K 4.1   < > 3.2*  --  3.2*  --   CL 105  --  121*  --  >130*  --   CO2 24  --  23  --  22  --   GLUCOSE 129*  --  134*  --  136*  --   BUN 27*  --  12  --  13  --   CREATININE 0.97  --  0.77  --  0.94  --   CALCIUM 8.4*  --  8.4*  --  8.6*  --   MG 1.3*  --   --   --  1.6*  --   PHOS 3.4  --   --   --  3.5  --    < > = values in this interval not displayed.    Lipid Panel:     Component Value Date/Time   TRIG 169 (H) 03/11/2019 0522   HgbA1c: No results found for: HGBA1C Urine Drug Screen:     Component Value Date/Time   LABOPIA NONE DETECTED 02/28/2019 1205   COCAINSCRNUR NONE DETECTED 03/24/2019 1205   LABBENZ NONE DETECTED 03/18/2019 1205   AMPHETMU NONE DETECTED 03/08/2019 1205   THCU NONE DETECTED 03/23/2019 1205   LABBARB NONE DETECTED 03/15/2019 1205    Alcohol Level     Component Value Date/Time   ETH <10 03/10/2019 0951     IMAGING   Ct Head Wo Contrast 03/12/2019 IMPRESSION:  1. Bilateral intraparenchymal hemorrhage, LEFT greater than RIGHT, with intraventricular extension, 1.5 cm LEFT to RIGHT midline shift, RIGHT hydrocephalus, and bilateral subarachnoid hemorrhage,. LEFT frontoparietal intraparenchymal hemorrhage measures 5 x 9 cm and RIGHT frontal hemorrhage measures 1.4 x 3.4 cm.  2. Nondisplaced fracture of the skull vertex.    Dg Chest Port 1 View 03/10/2019 IMPRESSION:  Endotracheal tube and nasogastric tube well positioned. Lungs remain well aerated.    Dg Chest Portable 1 View 03/03/2019 IMPRESSION:  Support apparatus as above.  No acute abnormalities in the chest.     Dg Chest [facility]  1 View 03/17/19 IMPRESSION:  No active disease.    Dg Abd Portable 1v 03/10/2019 IMPRESSION:  Orogastric tube tip in the gastric antrum.     EKG - SR rate 71 BPM. (See cardiology reading for complete details)    PHYSICAL EXAM Blood pressure (!) 153/85, pulse (!) 106, temperature 100 F (37.8 C), temperature source Rectal, resp. rate 17, height 5\' 7"  (1.702 m), weight 60.1 kg, SpO2 97 %. Frail middle-aged Asian male who is sedated and intubated. Not in distress. . Afebrile. Head is nontraumatic. Neck is supple without bruit.    Cardiac exam no murmur or gallop. Lungs are clear to auscultation. Distal pulses are well felt.  Neurological Exam : Sedated intubated. Eyes are closed. Responds to sternal rub with only minimally opening eyes. Eyes are in primary position. Pupils 3 mm equal very sluggishly reactive. Doll's movements are sluggish. Right corneal reflex is absent. Left is sluggish.Fundi not visualized. Mild right lower facial weakness. Tongue midline. motor system exam flexor withdrawal movements in the left upper and lower extremity. Trace withdrawal in the right upper extremity.No withdrawal in the right lower extremity.intermittent slight tremors of the upper extremities on stimulation right  greater than left.    ASSESSMENT/PLAN [name] is a 63 y.o. male with history of Htn, tobacco use and alcohol abuse presenting after being found unrespnsive. He did not receive IV t-PA due to hemprrhage  Bilateral intraparenchymal hemorrhage with a nondisplaced fracture of the skull vertex.etiology likely traumatic and alcohol-related. Doubt hypertensive  Resultant aphasia right hemiplegia  CT head - Bilateral intraparenchymal hemorrhage with a nondisplaced fracture of the skull vertex.  MRI head - not performed  MRA head - not performed  CTA H&N - not performed  Carotid Doppler - not indicated  2D Echo - not indicated  LDL - not performed  HgbA1c - not performed  UDS - negative  VTE prophylaxis - SCDs  Diet - NPO  No antithrombotic prior to admission, now on No antithrombotic  Ongoing aggressive stroke risk factor management  Therapy recommendations:  pending  Disposition:  Pending  Hypertension  Stable . Long-term BP goal normotensive  Hyperlipidemia  Lipid lowering medication PTA:  none  LDL - not indicated, goal < 70  Current lipid lowering medication: none  Continue statin at discharge  Other Stroke Risk Factors  Advanced age  Cigarette smoker - will be advised to stop smoking  ETOH use, advised to drink no more than 1 alcoholic beverage per day.   Other   NS consult - surgery not recommended  ETOH hx -> CIWA protocol  Hypertonic saline increased to 75 cc / hr  Anemia - Hb 10.5-> 8.4-> 8.8  Thrombocytopenia - 147->99  Na 134->138->141->141  Mg - 1.3  Dr Pearlean Brownie to meet with family when they arrive.  Consider repeat Head CT   Hospital day # 3   He presented with a fall at home while intoxicated and presented with altered mental status and right hemiplegia with bowed significantly limited blood pressure. CT scan shows large left frontal and smaller right frontal hemorrhage with some mass effect. Etiology likely  posttraumatic or alcohol-related rather than hypertensive hemorrhage. His serum sodium is above goal at 161.Patient's neurological exam has deteriorated. His prognosis is very poor and chances of survival with meaningful recovery are very bleak. I had a long family meeting at the bedside with patient's wife, daughter and granddaughter using a Burmese language interpreter through Radiation protection practitioner. I explained his poor prognosis and family  understands. They agreed to DO NOT RESUSCITATE and do not escalate. They need time to discuss with other family members for the next few days before the make a decision about withdrawal of care. I explained to them that the patient's condition is tolerating and he may likely pass away in the next few days. They express understanding. Discuss with Dr. Kendrick Fries and critical care. PA Rahul Celine Mans   This patient is critically ill and at significant risk of neurological worsening, death and care requires constant monitoring of vital signs, hemodynamics,respiratory and cardiac monitoring, extensive review of multiple databases, frequent neurological assessment, discussion with family, other specialists and medical decision making of high complexity.I have made any additions or clarifications directly to the above note.This critical care time does not reflect procedure time, or teaching time or supervisory time of PA/NP/Med Resident etc but could involve care discussion time.  I spent 30 minutes of neurocritical care time  in the care of  this patient.     Delia Heady, MD Medical Director [facility] Stroke Center Pager: [phone] 03/12/2019 1:08 PM   To contact Stroke Continuity provider, please refer to [email or web address]. After hours, contact General Neurology

## 2019-03-12 NOTE — Progress Notes (Signed)
[name], MRN:  [id number], DOB:  17-Jun-1956, LOS: 3 ADMISSION DATE:  03-22-2019, CONSULTATION DATE:  03-22-19 REFERRING MD:  EDP - [place], CHIEF COMPLAINT:  ICH and VDRF  Brief History   63 year old with history of etoh abuse and hypertension who presents to PCCM with a massive ICH after wife called EMS after not being able to wake up the patient.  Patient was covered in bloody vomit and snoring loudly.  Patient had consumed a lot of alcohol the night prior to presentation.  Patient was brought to the ED where he was intubated.  ED called NS and patient is not a surgical candidate.  PCCM was called to admit to the ICU.  History is obtained via remote translator.  Agreed upon continued supportive care and no escalation (with full DNR).  Past Medical History  Etoh abuse HTN  Significant Hospital Events   3/14 admission for ICH and GI bleed, not surgical candidate  3/15 localizing and tremulous w/ agitation. Sedated  3/17 family meeting using [employer] interpreter Jonathon Dennis).  Family asking to wait until Friday 3/20 so that they can update remaining family and local close friends and together make a decision.  Consults:  Neurology Neurosurgery  Procedures:  N/A  Significant Diagnostic Tests:  CT of the head 3/14 that I reviewed myself, massive ICH CTA head 3/15 > negative for aneurysm or large vessel occlusion, no vascular malformations.  Probable vasospasm of the left MCA branches.  Stable SAH and IVH.  Small volume para falcine SDH is increased.  Micro Data:  N/A  Antimicrobials:  N/A   Interim history/subjective:  No acute events.  Family meeting using [employer] interpreter Jonathon Dennis).  Family asking to wait until Friday 3/20 so that they can update remaining family and local close friends and together make a decision.  Agreed upon continued supportive care with no escalation (and full DNR).  Objective   Blood pressure 113/69, pulse (!) 104, temperature (!) 100.4 F (38 C),  temperature source Rectal, resp. rate 16, height 5\' 7"  (1.702 m), weight 60.1 kg, SpO2 99 %.    Vent Mode: PRVC FiO2 (%):  [40 %-70 %] 60 % Set Rate:  [15 bmp] 15 bmp Vt Set:  [520 mL] 520 mL PEEP:  [5 cmH20] 5 cmH20 Plateau Pressure:  [13 cmH20-25 cmH20] 25 cmH20   Intake/Output Summary (Last 24 hours) at 03/12/2019 0939 Last data filed at 03/12/2019 0900 Gross per 24 hour  Intake 2409.31 ml  Output 1975 ml  Net 434.31 ml   Filed Weights   Mar 22, 2019 1135 03/10/19 0345 03/12/19 0500  Weight: 58.7 kg 59.3 kg 60.1 kg    Examination: General: 63 year old male, critically ill though in NAD Neuro: Tremor with stimulation, otherwise non-responsive.  Requiring sedation due to tachycardia Pulm: CTAB Card: RRR no MRG GI:  BS x 4, S/NT/ND Ext: no edema.  Assessment & Plan:  63 year old male with history of HTN and etoh abuse who presents to PCCM with respiratory failure a large ICH that is not operable.  Discussed with PCCM-NP  Acute Encephalopathy 2/2 inoperable ICH  At risk for w/d  Plan 3% saline stopped this AM by neurology No CVL plans Family to discuss with extended family overseas and notify us by Friday 3/20 regarding their plans Continue thiamine and folate  HTN - resolved, cleviprex off AM 3/17 Plan Monitor  Acute respiratory failure w/ possible aspiration (no infiltrate) Plan Cont full vent support VAP bundle Awaiting family  decision regarding withdrawal vs trach (to notify us by 3/20) Bronchial hygiene Follow CXR intermittently  UGIB w/ associated Anemia ->? ETOH related gastritis?  ->not candidate for EGD Plan Stop PPI gtt and switch to BID dosing Trend CBC  Hypokalemia Plan 40 mEq K per tube Follow BMP  Hypomagnesemia Plan 2g Mag Follow BMP  GOC:  Family meeting using [employer] interpreter Jonathon Dennis).  Family asking to wait until Friday 3/20 so that they can update remaining family and local close friends and together make a decision.  Agreed upon  continued supportive care with no escalation (and full DNR).  Best practice    DVT: SCD Diet: NPO SUP: PPI gtt VAP: in place Sedation: fentanyl gtt. Dispo: as above  CC time: 30 min.   Jonathon Guys, PA [facility] Pager: [phone].  If no answer, [phone] 03/12/2019, 9:39 AM

## 2019-03-12 NOTE — Progress Notes (Signed)
CRITICAL VALUE ALERT  Critical Value:  Sodium 161  Date & Time Notied:  03/12/2019 0027  Provider Notified: [facility]  Orders Received/Actions taken: 3% stopped RN will continue to monitor

## 2019-03-13 ENCOUNTER — Inpatient Hospital Stay (HOSPITAL_COMMUNITY): Payer: Self-pay

## 2019-03-13 DIAGNOSIS — E440 Moderate protein-calorie malnutrition: Secondary | ICD-10-CM

## 2019-03-13 LAB — GLUCOSE, CAPILLARY
Glucose-Capillary: 153 mg/dL — ABNORMAL HIGH (ref 70–99)
Glucose-Capillary: 164 mg/dL — ABNORMAL HIGH (ref 70–99)
Glucose-Capillary: 166 mg/dL — ABNORMAL HIGH (ref 70–99)
Glucose-Capillary: 182 mg/dL — ABNORMAL HIGH (ref 70–99)
Glucose-Capillary: 232 mg/dL — ABNORMAL HIGH (ref 70–99)

## 2019-03-13 LAB — BASIC METABOLIC PANEL
BUN: 24 mg/dL — ABNORMAL HIGH (ref 8–23)
CO2: 22 mmol/L (ref 22–32)
Calcium: 9 mg/dL (ref 8.9–10.3)
Chloride: >130 mmol/L (ref 98–111)
Creatinine, Ser: 1.09 mg/dL (ref 0.61–1.24)
GFR calc Af Amer: >60 mL/min (ref >60)
GFR calc non Af Amer: >60 mL/min (ref >60)
Glucose, Bld: 158 mg/dL — ABNORMAL HIGH (ref 70–99)
Potassium: 3.6 mmol/L (ref 3.5–5.1)
Sodium: 161 mmol/L (ref 135–145)

## 2019-03-13 LAB — SODIUM
SODIUM: 161 mmol/L — AB (ref 135–145)
Sodium: 160 mmol/L — ABNORMAL HIGH (ref 135–145)
Sodium: 160 mmol/L — ABNORMAL HIGH (ref 135–145)

## 2019-03-13 LAB — CBC
HCT: 28 % — ABNORMAL LOW (ref 39.0–52.0)
Hemoglobin: 8.3 g/dL — ABNORMAL LOW (ref 13.0–17.0)
MCH: 19.5 pg — ABNORMAL LOW (ref 26.0–34.0)
MCHC: 29.6 g/dL — ABNORMAL LOW (ref 30.0–36.0)
MCV: 65.7 fL — ABNORMAL LOW (ref 80.0–100.0)
PLATELETS: 85 10*3/uL — AB (ref 150–400)
RBC: 4.26 MIL/uL (ref 4.22–5.81)
RDW: 16.9 % — ABNORMAL HIGH (ref 11.5–15.5)
WBC: 4.5 10*3/uL (ref 4.0–10.5)
nRBC: 1.8 % — ABNORMAL HIGH (ref 0.0–0.2)

## 2019-03-13 LAB — MAGNESIUM
MAGNESIUM: 2 mg/dL (ref 1.7–2.4)
Magnesium: 1.9 mg/dL (ref 1.7–2.4)

## 2019-03-13 LAB — PHOSPHORUS
PHOSPHORUS: 3 mg/dL (ref 2.5–4.6)
PHOSPHORUS: 1.5 mg/dL — AB (ref 2.5–4.6)

## 2019-03-13 LAB — PROCALCITONIN: Procalcitonin: 7.32 ng/mL

## 2019-03-13 MED ORDER — VITAL AF 1.2 CAL PO LIQD
1000.0000 mL | ORAL | Status: DC
  Administered 2019-03-13: 1000 mL

## 2019-03-13 MED ORDER — PRO-STAT SUGAR FREE PO LIQD
30.0000 mL | Freq: Two times a day (BID) | ORAL | Status: DC
  Administered 2019-03-13: 30 mL
  Filled 2019-03-13: qty 30

## 2019-03-13 MED ORDER — SODIUM CHLORIDE 0.9 % IV SOLN
2.0000 g | Freq: Two times a day (BID) | INTRAVENOUS | Status: DC
  Administered 2019-03-13 – 2019-03-14 (×2): 2 g via INTRAVENOUS
  Filled 2019-03-13 (×3): qty 2

## 2019-03-13 MED ORDER — VANCOMYCIN HCL 10 G IV SOLR
1250.0000 mg | Freq: Once | INTRAVENOUS | Status: AC
  Administered 2019-03-13: 1250 mg via INTRAVENOUS
  Filled 2019-03-13: qty 1250

## 2019-03-13 MED ORDER — VITAL HIGH PROTEIN PO LIQD
1000.0000 mL | ORAL | Status: DC
  Administered 2019-03-13: 1000 mL

## 2019-03-13 MED ORDER — FENTANYL CITRATE (PF) 100 MCG/2ML IJ SOLN
100.0000 ug | INTRAMUSCULAR | Status: DC | PRN
  Administered 2019-03-13 – 2019-03-14 (×6): 100 ug via INTRAVENOUS
  Filled 2019-03-13 (×6): qty 2

## 2019-03-13 MED ORDER — FREE WATER
200.0000 mL | Freq: Three times a day (TID) | Status: DC
  Administered 2019-03-13 – 2019-03-14 (×4): 200 mL

## 2019-03-13 MED ORDER — SODIUM CHLORIDE 0.9 % IV SOLN
2.0000 g | Freq: Two times a day (BID) | INTRAVENOUS | Status: DC
  Filled 2019-03-13: qty 2

## 2019-03-13 MED ORDER — MIDAZOLAM HCL 2 MG/2ML IJ SOLN: 2.0000 mg | INTRAMUSCULAR | Status: DC | PRN

## 2019-03-13 MED ORDER — MIDAZOLAM HCL 2 MG/2ML IJ SOLN
2.0000 mg | INTRAMUSCULAR | Status: DC | PRN
  Administered 2019-03-14: 2 mg via INTRAVENOUS
  Filled 2019-03-13: qty 2

## 2019-03-13 MED ORDER — VANCOMYCIN HCL 10 G IV SOLR
1250.0000 mg | INTRAVENOUS | Status: DC
  Filled 2019-03-13: qty 1250

## 2019-03-13 MED ORDER — METOPROLOL TARTRATE 25 MG/10 ML ORAL SUSPENSION
25.0000 mg | Freq: Two times a day (BID) | ORAL | Status: DC
  Administered 2019-03-13 – 2019-03-14 (×3): 25 mg via ORAL
  Filled 2019-03-13 (×3): qty 10

## 2019-03-13 MED ORDER — SODIUM CHLORIDE 0.9 % IV SOLN
2.0000 g | Freq: Once | INTRAVENOUS | Status: AC
  Administered 2019-03-13: 2 g via INTRAVENOUS
  Filled 2019-03-13: qty 2

## 2019-03-13 MED ORDER — FENTANYL CITRATE (PF) 100 MCG/2ML IJ SOLN
100.0000 ug | INTRAMUSCULAR | Status: DC | PRN
  Administered 2019-03-14: 100 ug via INTRAVENOUS
  Filled 2019-03-13: qty 2

## 2019-03-13 MED ORDER — POTASSIUM CHLORIDE 20 MEQ/15ML (10%) PO SOLN
40.0000 meq | Freq: Once | ORAL | Status: AC
  Administered 2019-03-13: 40 meq
  Filled 2019-03-13: qty 30

## 2019-03-13 NOTE — Progress Notes (Signed)
Pharmacy Antibiotic Note  Jonathon Dennis is a 63 y.o. male admitted on 03/10/2019 with an ICH and acute respiratory failure. Starting empiric antibiotics for a possible aspiration pneumonia. SCr 1, low grade fever.  Vancomycin 1250 mg IV Q 24 hrs. Goal AUC 400-550. Expected AUC: 490 SCr used: 1  Plan: -Vancomycin 1250 mg IV q24h -Cefepime 2 g IV q12h -Monitor renal fx, cultures, levels as needed   Height: 5\' 7"  (170.2 cm) Weight: 132 lb 0.9 oz (59.9 kg) IBW/kg (Calculated) : 66.1  Temp (24hrs), Avg:99.8 F (37.7 C), Min:98.2 F (36.8 C), Max:101.3 F (38.5 C)  Recent Labs  Lab 02/24/2019 1130 03/08/2019 1335 03/10/19 0304 03/11/19 0522 03/12/19 0613 03/13/19 0537  WBC 9.5  --  6.4 7.1 2.7* 4.5  CREATININE 0.72  --  0.97 0.77 0.94 1.09  LATICACIDVEN 4.9* 5.0*  --   --   --   --      Antimicrobials this admission: 3/18 vancomycin > 3/18 cefepime >  Dose adjustments this admission: N/A  Microbiology results: 3/18 resp cx: ip 3/14 blood cx: ngtd 3/14 mrsa pcr: neg   Baldemar Friday 03/13/2019 10:35 AM

## 2019-03-13 NOTE — Progress Notes (Signed)
CRITICAL VALUE ALERT  Critical Value: Sodium 161 & Chloride >30  Date & Time Notied:  03/13/2019 & 4403   Provider Notified: [facility], MD   Orders Received/Actions taken: Hold hypertonic saline

## 2019-03-13 NOTE — Progress Notes (Signed)
STROKE TEAM PROGRESS NOTE    SUBJECTIVE (INTERVAL HISTORY) His is not at the bedside.. The pt is intubated.his neurologically slightly more arousable but still not following command or having spontaneous movements. He can withdraw the left side slightly. Serum sodium is at 161 despite hypertonic saline being discontinued. He has low-grade fever and chest x-ray shows possible infiltrates and pulmonary critical care team is pending to start empiric antibiotics for aspiration.   OBJECTIVE Vitals:   03/13/19 1200 03/13/19 1215 03/13/19 1230 03/13/19 1245  BP: 121/66 122/64 135/66 131/66  Pulse: 76 75 77 78  Resp: (!) 23 (!) 24 (!) 27 (!) 28  Temp: 99.9 F (37.7 C) 100.2 F (37.9 C) (!) 100.6 F (38.1 C) (!) 101.1 F (38.4 C)  TempSrc: Esophageal     SpO2: 100% 97% 97% 94%  Weight:      Height:        CBC:  Recent Labs  Lab 03/19/2019 1130  03/12/19 0613 03/13/19 0537  WBC 9.5   < > 2.7* 4.5  NEUTROABS 8.3*  --   --   --   HGB 10.5*   < > 9.1* 8.3*  HCT 33.0*   < > 31.3* 28.0*  MCV 63.0*   < > 67.6* 65.7*  PLT 147*   < > 103* 85*   < > = values in this interval not displayed.    Basic Metabolic Panel:  Recent Labs  Lab 03/12/19 0613  03/13/19 0537 03/13/19 1132  NA 160*   < > 161* 161*  K 3.2*  --  3.6  --   CL >130*  --  >130*  --   CO2 22  --  22  --   GLUCOSE 136*  --  158*  --   BUN 13  --  24*  --   CREATININE 0.94  --  1.09  --   CALCIUM 8.6*  --  9.0  --   MG 1.6*  --  2.0  --   PHOS 3.5  --  3.0  --    < > = values in this interval not displayed.    Lipid Panel:     Component Value Date/Time   TRIG 169 (H) 03/11/2019 0522   HgbA1c: No results found for: HGBA1C Urine Drug Screen:     Component Value Date/Time   LABOPIA NONE DETECTED 03/12/2019 1205   COCAINSCRNUR NONE DETECTED 02/28/2019 1205   LABBENZ NONE DETECTED 03/08/2019 1205   AMPHETMU NONE DETECTED 03/21/2019 1205   THCU NONE DETECTED 03/07/2019 1205   LABBARB NONE DETECTED 03/23/2019  1205    Alcohol Level     Component Value Date/Time   ETH <10 03/10/2019 0951    IMAGING   Ct Head Wo Contrast 03/20/2019 IMPRESSION:  1. Bilateral intraparenchymal hemorrhage, LEFT greater than RIGHT, with intraventricular extension, 1.5 cm LEFT to RIGHT midline shift, RIGHT hydrocephalus, and bilateral subarachnoid hemorrhage,. LEFT frontoparietal intraparenchymal hemorrhage measures 5 x 9 cm and RIGHT frontal hemorrhage measures 1.4 x 3.4 cm.  2. Nondisplaced fracture of the skull vertex.    Dg Chest Port 1 View 03/10/2019 IMPRESSION:  Endotracheal tube and nasogastric tube well positioned. Lungs remain well aerated.    Dg Chest Portable 1 View 03/22/2019 IMPRESSION:  Support apparatus as above.  No acute abnormalities in the chest.     Dg Chest Port 1 View 03/02/2019 IMPRESSION:  No active disease.    Dg Abd Portable 1v 03/10/2019 IMPRESSION:  Orogastric tube tip in the  gastric antrum.     EKG - SR rate 71 BPM. (See cardiology reading for complete details)    PHYSICAL EXAM Blood pressure 131/66, pulse 78, temperature (!) 101.1 F (38.4 C), resp. rate (!) 28, height  (1.702 m), weight 59.9 kg, SpO2 94 %. Frail middle-aged Asian male who is sedated and intubated. Not in distress. . Afebrile. Head is nontraumatic. Neck is supple without bruit.    Cardiac exam no murmur or gallop. Lungs are clear to auscultation. Distal pulses are well felt.  Neurological Exam : Sedated intubated. Eyes are closed. Responds to sternal rub with only minimally opening eyes. Eyes are in primary position. Pupils 3 mm equal very sluggishly reactive. Doll's movements are sluggish. Right corneal reflex is absent. Left is sluggish.Fundi not visualized. Mild right lower facial weakness. Tongue midline. motor system exam flexor withdrawal movements in the left upper and lower extremity. Trace withdrawal in the right upper extremity.No withdrawal in the right lower extremity.intermittent  slight tremors of the upper extremities on stimulation right greater than left.    ASSESSMENT/PLAN Jonathon Dennis is a 63 y.o. male with history of Htn, tobacco use and alcohol abuse presenting after being found unrespnsive. He did not receive IV t-PA due to hemorrhage  Bilateral intraparenchymal hemorrhage with a nondisplaced fracture of the skull vertex.etiology likely traumatic and alcohol-related. Doubt hypertensive  Resultant aphasia right hemiplegia  CT head - Bilateral intraparenchymal hemorrhage with a nondisplaced fracture of the skull vertex.  MRI head - not performed  MRA head - not performed  CTA H&N - not performed  Carotid Doppler - not indicated  2D Echo - not indicated  LDL - not performed  HgbA1c - not performed  UDS - negative  VTE prophylaxis - SCDs  Diet - NPO  No antithrombotic prior to admission, now on No antithrombotic  Ongoing aggressive stroke risk factor management  Therapy recommendations:  pending  Disposition:  Pending  Hypertension  Stable . Long-term BP goal normotensive  Hyperlipidemia  Lipid lowering medication PTA:  none  LDL - not indicated, goal < 70  Current lipid lowering medication: none  Continue statin at discharge  Other Stroke Risk Factors  Advanced age  Cigarette smoker - will be advised to stop smoking  ETOH use, advised to drink no more than 1 alcoholic beverage per day.   Other   NS consult - surgery not recommended  ETOH hx -> CIWA protocol  Induced hypernatremia-Hypertonic saline stopped 03/11/19  Anemia - Hb 10.5-> 8.4-> 8.8  Thrombocytopenia - 147->99  Na 134->138->141->141- 161  Mg - 1.3 Low-grade fever and abnormal chest x-ray possible aspiration pneumonia-and antibiotics started 03/13/19 Hospital day # 4   He presented with a fall at home while intoxicated and presented with altered mental status and right hemiplegia with bowed significantly limited blood pressure. CT scan shows  large left frontal and smaller right frontal hemorrhage with some mass effect. Etiology likely posttraumatic or alcohol-related rather than hypertensive hemorrhage. His serum sodium is above goal at 161.Patient's neurological exam has deteriorated. His prognosis is very poor and chances of survival with meaningful recovery are very bleak. I had a long family meeting at the bedside with patient's wife, daughter and granddaughter  On 03/12/19 using a Burmese language interpreter through Radiation protection practitioner. I explained his poor prognosis and family understands. They agreed to DO NOT RESUSCITATE and do not escalate. They need time to discuss with other family members for the next few days before the make a decision  about withdrawal of care. I explained to them that the patient's condition is tolerating and he may likely pass away in the next few days. They expressed understanding.Family is not available today for further discussion..recommend start free water 200 mL every 6 hourly via feeding tube to correct his induced hypernatremia. Discussed with Dr. Rudene Christians.   This patient is critically ill and at significant risk of neurological worsening, death and care requires constant monitoring of vital signs, hemodynamics,respiratory and cardiac monitoring, extensive review of multiple databases, frequent neurological assessment, discussion with family, other specialists and medical decision making of high complexity.I have made any additions or clarifications directly to the above note.This critical care time does not reflect procedure time, or teaching time or supervisory time of PA/NP/Med Resident etc but could involve care discussion time.  I spent 30 minutes of neurocritical care time  in the care of  this patient.     Delia Heady, MD Medical Director [facility] Stroke Center Pager: [phone] 03/13/2019 1:03 PM   To contact Stroke Continuity provider, please refer to [email or web address]. After hours, contact  General Neurology

## 2019-03-13 NOTE — Progress Notes (Signed)
Initial Nutrition Assessment  DOCUMENTATION CODES:   Non-severe (moderate) malnutrition in context of social or environmental circumstances  INTERVENTION:   Vital AF 1.2 @ 55 ml/hr (1320 ml) via OGT  Provides: 1584 kcals (1872 kcal with cleviprex), 99 grams protein, 1071 ml free water. Meets 107% calorie needs and 100% of protein needs.   NUTRITION DIAGNOSIS:   Moderate Malnutrition related to social / environmental circumstances(ETOH abuse) as evidenced by mild fat depletion, moderate muscle depletion, severe muscle depletion.  GOAL:   Provide needs based on ASPEN/SCCM guidelines  MONITOR:   Vent status, Skin, TF tolerance, Weight trends, Labs, I & O's  REASON FOR ASSESSMENT:   Ventilator, Consult Enteral/tube feeding initiation and management  ASSESSMENT:   Patient with PMH significant for ETOH abuse and HTN. Presents this admission with massive ICH and GI bleed after suspected ETOH binge.    3/16- 3% saline stopped   ICH inoperable. Not a candidate for EGD. Family to make final decision regarding goals of care Friday 3/20 as [place]. Per palliative, no escalation of care at this time. Pt tolerating Vital High Protein with 30 ml Prostat BID. TF plan discussed with RN.   No family at bedside to provide nutrition history. Weight history limited in the chart. Will utilize EDW of 58.7 kg to estimate needs.   Patient is currently intubated on ventilator support MV: 12.3 L/min Temp (24hrs), Avg:99.6 F (37.6 C), Min:98.2 F (36.8 C), Max:101.3 F (38.5 C) BP: 117/72 MAP: 86  Cleviprex: 6 ml/hr- provides 288 kcal   I/O: +2018 ml since admit UOP: 647 ml x 24 hrs   Medications reviewed and include: folic acid, MVI with minerals, thiamine, cleviprex Labs reviewed: Na 161 (H)   NUTRITION - FOCUSED PHYSICAL EXAM:    Most Recent Value  Orbital Region  Mild depletion  Upper Arm Region  Mild depletion  Thoracic and Lumbar Region  Unable to assess  Buccal  Region  Unable to assess  Temple Region  Moderate depletion  Clavicle Bone Region  Moderate depletion  Clavicle and Acromion Bone Region  Moderate depletion  Scapular Bone Region  Unable to assess  Dorsal Hand  Moderate depletion  Patellar Region  Severe depletion  Anterior Thigh Region  Severe depletion  Posterior Calf Region  Severe depletion  Edema (RD Assessment)  None  Hair  Reviewed  Eyes  Unable to assess  Mouth  Unable to assess  Skin  Reviewed  Nails  Reviewed     Diet Order:   Diet Order            Diet NPO time specified  Diet effective now              EDUCATION NEEDS:   Not appropriate for education at this time  Skin:  Skin Assessment: Reviewed RN Assessment  Last BM:  PTA  Height:   Ht Readings from Last 1 Encounters:  04-06-2019 5\' 7"  (1.702 m)    Weight:   Wt Readings from Last 1 Encounters:  03/13/19 59.9 kg    Ideal Body Weight:  67.3 kg  BMI:  Body mass index is 20.68 kg/m.  Estimated Nutritional Needs:   Kcal:  1767 kcal  Protein:  90-115 grams  Fluid:  >/= 1.7 L/day   Vanessa Kick RD, LDN Clinical Nutrition Pager # - [phone]

## 2019-03-13 NOTE — Progress Notes (Addendum)
NAMEDarrence Siegman, MRN:  [id number], DOB:  [birth year], LOS: 4 ADMISSION DATE:  04/04/2019, CONSULTATION DATE:  2019/04/04 REFERRING MD:  EDP - Geiple, CHIEF COMPLAINT:  ICH and VDRF  Brief History   63 year old with history of etoh abuse and hypertension who presents to PCCM with a massive ICH after wife called EMS after not being able to wake up the patient.  Patient was covered in bloody vomit and snoring loudly.  Patient had consumed a lot of alcohol the night prior to presentation.  Patient was brought to the ED where he was intubated.  ED called NS and patient is not a surgical candidate.  PCCM was called to admit to the ICU.  History is obtained via remote translator.  Agreed upon continued supportive care and no escalation (with full DNR).  Past Medical History  Etoh abuse HTN  Significant Hospital Events   3/14 admission for ICH and GI bleed, not surgical candidate  3/15 localizing and tremulous w/ agitation. Sedated  3/17 family meeting using [employer] interpreter [name].  Family asking to wait until Friday 3/20 so that they can update remaining family and local close friends and together make a decision.  Consults:  Neurology Neurosurgery  Procedures:  N/A  Significant Diagnostic Tests:  CT of the head 3/14 that I reviewed myself, massive ICH CTA head 3/15 > negative for aneurysm or large vessel occlusion, no vascular malformations.  Probable vasospasm of the left MCA branches.  Stable SAH and IVH.  Small volume para falcine SDH is increased.  Micro Data:  N/A  Antimicrobials:  N/A   Interim history/subjective:  No acute events overnight. Did require Fentanyl to be restarted due to tachy and HTN presumably due to discomfort.   Objective   Blood pressure (!) 158/75, pulse 83, temperature (!) 100.5 F (38.1 C), temperature source Axillary, resp. rate (!) 21, height 5\' 7"  (1.702 m), weight 59.9 kg, SpO2 94 %.    Vent Mode: PRVC FiO2 (%):  [40 %-60 %] 40 % Set Rate:   [15 bmp] 15 bmp Vt Set:  [520 mL] 520 mL PEEP:  [5 cmH20] 5 cmH20 Plateau Pressure:  [13 cmH20-21 cmH20] 21 cmH20   Intake/Output Summary (Last 24 hours) at 03/13/2019 0734 Last data filed at 03/13/2019 0700 Gross per 24 hour  Intake 416.79 ml  Output 747 ml  Net -330.21 ml   Filed Weights   03/10/19 0345 03/12/19 0500 03/13/19 0453  Weight: 59.3 kg 60.1 kg 59.9 kg    Examination:  General: Elderly appearing male in NAD Neuro: moves L arm to pain. Positive cough/gag. Agitated on vent.  Pulm: Diminished Card: Tachy, regular. No MRG GI:  BS x 4, S/NT/ND Ext: no edema.  Assessment & Plan:  63 year old male with history of HTN and etoh abuse who presents to PCCM with respiratory failure a large ICH that is not operable.  Acute Encephalopathy 2/2 inoperable ICH: S/p 3% NaCL stopped 3/16 At risk for w/d  Plan Family to discuss with extended family [place] and notify us by Friday 3/20 regarding their plans Continue thiamine and folate PRN fentanyl and versed for RASS goal 0.   HTN - Cleviprex back on 3/18 Plan Start home metoprolol via tube.  Wean Cleviprex for BP goal 140-160 Monitor  Acute respiratory failure w/ possible aspiration (no infiltrate): hypoxemic on exam 3/18 requiring increase in Fio2 and frequent suctioning of tan secretions. Has been febrile throughout admission.  Plan Cont full vent support VAP  bundle Tracheal aspirate CXR now > Update: CXR reveals consolidation. Will start Cefepime and Vancomycin for HCAP  UGIB w/ associated Anemia ->? ETOH related gastritis?  ->not candidate for EGD Plan PPI BID dosing Trend CBC  Hypokalemia Plan 40 mEq K per tube Follow BMP  Hypomagnesemia Plan Follow BMP  GOC:  Family meeting using [employer] interpreter [name] 3/17.  Family asking to wait until Friday 3/20 so that they can update remaining family and local close friends and together make a decision.  Agreed upon continued supportive care with no  escalation (and full DNR). No family available at bedside 3/18.  Best practice    DVT: SCD Diet: NPO - TF SUP: PPI gtt VAP: in place Sedation: fentanyl and versed PRN Dispo: as above  CC time: 30 min    Joneen Roach, AGACNP-BC [facility] Pulmonary/Critical Care Pager [phone] or [phone]  03/13/2019 7:35 AM

## 2019-03-14 LAB — CBC WITH DIFFERENTIAL/PLATELET
Abs Immature Granulocytes: 0.05 10*3/uL (ref 0.00–0.07)
Basophils Absolute: 0 10*3/uL (ref 0.0–0.1)
Basophils Relative: 0 %
Eosinophils Absolute: 0 10*3/uL (ref 0.0–0.5)
Eosinophils Relative: 0 %
HCT: 25.4 % — ABNORMAL LOW (ref 39.0–52.0)
HEMOGLOBIN: 7.9 g/dL — AB (ref 13.0–17.0)
Immature Granulocytes: 1 %
LYMPHS ABS: 0.8 10*3/uL (ref 0.7–4.0)
LYMPHS PCT: 16 %
MCH: 19.8 pg — ABNORMAL LOW (ref 26.0–34.0)
MCHC: 31.1 g/dL (ref 30.0–36.0)
MCV: 63.7 fL — ABNORMAL LOW (ref 80.0–100.0)
Monocytes Absolute: 0.3 10*3/uL (ref 0.1–1.0)
Monocytes Relative: 6 %
Neutro Abs: 3.7 10*3/uL (ref 1.7–7.7)
Neutrophils Relative %: 77 %
Platelets: 72 10*3/uL — ABNORMAL LOW (ref 150–400)
RBC: 3.99 MIL/uL — ABNORMAL LOW (ref 4.22–5.81)
RDW: 16.4 % — ABNORMAL HIGH (ref 11.5–15.5)
WBC: 4.8 10*3/uL (ref 4.0–10.5)
nRBC: 1.5 % — ABNORMAL HIGH (ref 0.0–0.2)

## 2019-03-14 LAB — MAGNESIUM: Magnesium: 2.1 mg/dL (ref 1.7–2.4)

## 2019-03-14 LAB — PHOSPHORUS: Phosphorus: 2.1 mg/dL — ABNORMAL LOW (ref 2.5–4.6)

## 2019-03-14 LAB — SODIUM
Sodium: 159 mmol/L — ABNORMAL HIGH (ref 135–145)
Sodium: 159 mmol/L — ABNORMAL HIGH (ref 135–145)
Sodium: 161 mmol/L (ref 135–145)

## 2019-03-14 LAB — BASIC METABOLIC PANEL
BUN: 36 mg/dL — AB (ref 8–23)
CO2: 22 mmol/L (ref 22–32)
Calcium: 8.9 mg/dL (ref 8.9–10.3)
Chloride: >130 mmol/L (ref 98–111)
Creatinine, Ser: 1.21 mg/dL (ref 0.61–1.24)
GFR calc Af Amer: >60 mL/min (ref >60)
GFR calc non Af Amer: >60 mL/min (ref >60)
Glucose, Bld: 205 mg/dL — ABNORMAL HIGH (ref 70–99)
POTASSIUM: 3.5 mmol/L (ref 3.5–5.1)
Sodium: 160 mmol/L — ABNORMAL HIGH (ref 135–145)

## 2019-03-14 LAB — CULTURE, BLOOD (ROUTINE X 2)
Culture: NO GROWTH
Culture: NO GROWTH
Special Requests: ADEQUATE
Special Requests: ADEQUATE

## 2019-03-14 LAB — PROCALCITONIN: Procalcitonin: 8.35 ng/mL

## 2019-03-14 LAB — GLUCOSE, CAPILLARY
Glucose-Capillary: 168 mg/dL — ABNORMAL HIGH (ref 70–99)
Glucose-Capillary: 153 mg/dL — ABNORMAL HIGH (ref 70–99)

## 2019-03-14 MED ORDER — GLYCOPYRROLATE 0.2 MG/ML IJ SOLN: 0.2000 mg | INTRAMUSCULAR | Status: DC | PRN

## 2019-03-14 MED ORDER — ACETAMINOPHEN 325 MG PO TABS: 650.0000 mg | ORAL_TABLET | Freq: Four times a day (QID) | ORAL | Status: DC | PRN

## 2019-03-14 MED ORDER — MORPHINE SULFATE (PF) 2 MG/ML IV SOLN: 2.0000 mg | INTRAVENOUS | Status: DC | PRN

## 2019-03-14 MED ORDER — DIPHENHYDRAMINE HCL 50 MG/ML IJ SOLN: 25.0000 mg | INTRAMUSCULAR | Status: DC | PRN

## 2019-03-14 MED ORDER — POLYVINYL ALCOHOL 1.4 % OP SOLN
1.0000 [drp] | Freq: Four times a day (QID) | OPHTHALMIC | Status: DC | PRN
  Filled 2019-03-14: qty 15

## 2019-03-14 MED ORDER — FREE WATER
250.0000 mL | Freq: Four times a day (QID) | Status: DC
  Administered 2019-03-14: 250 mL

## 2019-03-14 MED ORDER — HYDRALAZINE HCL 20 MG/ML IJ SOLN
10.0000 mg | INTRAMUSCULAR | Status: DC | PRN
  Administered 2019-03-14: 20 mg via INTRAVENOUS
  Filled 2019-03-14: qty 1

## 2019-03-14 MED ORDER — ENOXAPARIN SODIUM 40 MG/0.4ML ~~LOC~~ SOLN
40.0000 mg | SUBCUTANEOUS | Status: DC
  Administered 2019-03-14: 40 mg via SUBCUTANEOUS
  Filled 2019-03-14: qty 0.4

## 2019-03-14 MED ORDER — DEXTROSE 5 % IV SOLN: INTRAVENOUS | Status: DC

## 2019-03-14 MED ORDER — MORPHINE 100MG IN NS 100ML (1MG/ML) PREMIX INFUSION
0.0000 mg/h | INTRAVENOUS | Status: DC
  Administered 2019-03-14: 5 mg/h via INTRAVENOUS
  Filled 2019-03-14: qty 100

## 2019-03-14 MED ORDER — MORPHINE BOLUS VIA INFUSION
5.0000 mg | INTRAVENOUS | Status: DC | PRN
  Filled 2019-03-14: qty 5

## 2019-03-14 MED ORDER — GLYCOPYRROLATE 1 MG PO TABS
1.0000 mg | ORAL_TABLET | ORAL | Status: DC | PRN
  Filled 2019-03-14: qty 1

## 2019-03-14 MED ORDER — LORAZEPAM 2 MG/ML IJ SOLN: 2.0000 mg | INTRAMUSCULAR | Status: DC | PRN

## 2019-03-14 MED ORDER — ACETAMINOPHEN 650 MG RE SUPP: 650.0000 mg | Freq: Four times a day (QID) | RECTAL | Status: DC | PRN

## 2019-03-14 MED ORDER — AMLODIPINE BESYLATE 5 MG PO TABS
5.0000 mg | ORAL_TABLET | Freq: Every day | ORAL | Status: DC
  Administered 2019-03-14: 5 mg via ORAL
  Filled 2019-03-14: qty 1

## 2019-03-15 ENCOUNTER — Telehealth: Payer: Self-pay | Admitting: Pulmonary Disease

## 2019-03-15 NOTE — Telephone Encounter (Incomplete)
03/15/19 received D/C from [employer], spoke to Dr. Denese Killings he said to deliever to 4N. PWR

## 2019-03-16 LAB — CULTURE, RESPIRATORY W GRAM STAIN

## 2019-03-19 ENCOUNTER — Telehealth: Payer: Self-pay

## 2019-03-19 NOTE — Telephone Encounter (Signed)
Patient called to nurses station asking for tray to be picked up after dinner, called a second time approximately 1-2 min later yelling at secretary that tray was not picked up yet. Charge RN informed patient that yelling at staff is not appropriate and staff will be in to address needs when able. Pt states "well if they do their damn jobs they wont have to be yelled at". Informed patient that if she continues to yell staff will be instructed to leave room and allow patient to cool off and will reenter when behavior is appropriate. Patient continued to yell and RN left room. Communicated expectations to oncoming shift.

## 2019-03-19 NOTE — Telephone Encounter (Signed)
Received dc back from Doctor Agarwala.  Got dc ready and called funeral home to let them know dc is ready for pickup. I also faxed a copy per the funeral home request.

## 2019-03-27 NOTE — Progress Notes (Signed)
STROKE TEAM PROGRESS NOTE    SUBJECTIVE (INTERVAL HISTORY) No one is not at the bedside.. The pt is intubated.his neurologically stable but still not following commands but is  having spontaneous movements on left side. He can withdraw the left side slightly. Serum sodium is at 16o despite hypertonic saline being discontinued and starting free water.Family has not yet made decision on withdrawal of support  OBJECTIVE Vitals:   03/20/2019 1000 03/20/2019 1030 03/20/2019 1100 03-20-19 1114  BP: (!) 156/69 (!) 150/92 (!) 155/81 (!) 155/81  Pulse: 88 95 (!) 106 98  Resp: (!) 33 (!) 33 (!) 32 (!) 33  Temp: 99.7 F (37.6 C) (!) 100.4 F (38 C) (!) 100.8 F (38.2 C) (!) 100.9 F (38.3 C)  TempSrc:      SpO2: 93% 93% 93% 94%  Weight:      Height:        CBC:  Recent Labs  Lab 02/27/2019 1130  03/13/19 0537 03-20-2019 0733  WBC 9.5   < > 4.5 4.8  NEUTROABS 8.3*  --   --  3.7  HGB 10.5*   < > 8.3* 7.9*  HCT 33.0*   < > 28.0* 25.4*  MCV 63.0*   < > 65.7* 63.7*  PLT 147*   < > 85* 72*   < > = values in this interval not displayed.    Basic Metabolic Panel:  Recent Labs  Lab 03/13/19 0537  03/13/19 1637  Mar 20, 2019 0001  2019-03-20 0733 20-Mar-2019 1138  NA 161*   < >  --    < > 159*   < > 160* 159*  K 3.6  --   --   --   --   --  3.5  --   CL >130*  --   --   --   --   --  >130*  --   CO2 22  --   --   --   --   --  22  --   GLUCOSE 158*  --   --   --   --   --  205*  --   BUN 24*  --   --   --   --   --  36*  --   CREATININE 1.09  --   --   --   --   --  1.21  --   CALCIUM 9.0  --   --   --   --   --  8.9  --   MG 2.0  --  1.9  --  2.1  --   --   --   PHOS 3.0  --  1.5*  --  2.1*  --   --   --    < > = values in this interval not displayed.    Lipid Panel:     Component Value Date/Time   TRIG 169 (H) 03/11/2019 0522   HgbA1c: No results found for: HGBA1C Urine Drug Screen:     Component Value Date/Time   LABOPIA NONE DETECTED 03/13/2019 1205   COCAINSCRNUR NONE DETECTED  03/16/2019 1205   LABBENZ NONE DETECTED 03/17/2019 1205   AMPHETMU NONE DETECTED 03/22/2019 1205   THCU NONE DETECTED 03/11/2019 1205   LABBARB NONE DETECTED 03/21/2019 1205    Alcohol Level     Component Value Date/Time   ETH <10 03/10/2019 0951    IMAGING   Ct Head Wo Contrast 03/15/2019 IMPRESSION:  1. Bilateral intraparenchymal hemorrhage, LEFT  greater than RIGHT, with intraventricular extension, 1.5 cm LEFT to RIGHT midline shift, RIGHT hydrocephalus, and bilateral subarachnoid hemorrhage,. LEFT frontoparietal intraparenchymal hemorrhage measures 5 x 9 cm and RIGHT frontal hemorrhage measures 1.4 x 3.4 cm.  2. Nondisplaced fracture of the skull vertex.    Dg Chest Port 1 View 03/10/2019 IMPRESSION:  Endotracheal tube and nasogastric tube well positioned. Lungs remain well aerated.    Dg Chest Portable 1 View March 23, 2019 IMPRESSION:  Support apparatus as above.  No acute abnormalities in the chest.     Dg Chest Port 1 View 23-Mar-2019 IMPRESSION:  No active disease.    Dg Abd Portable 1v 03/10/2019 IMPRESSION:  Orogastric tube tip in the gastric antrum.     EKG - SR rate 71 BPM. (See cardiology reading for complete details)    PHYSICAL EXAM Blood pressure (!) 155/81, pulse 98, temperature (!) 100.9 F (38.3 C), resp. rate (!) 33, height 5\' 7"  (1.702 m), weight 61.9 kg, SpO2 94 %. Frail middle-aged Asian male who is   intubated. Not in distress. . Afebrile. Head is nontraumatic. Neck is supple without bruit.    Cardiac exam no murmur or gallop. Lungs are clear to auscultation. Distal pulses are well felt.  Neurological Exam :  intubated. Eyes are closed. Responds to sternal rub with only minimally opening eyes. Eyes are in primary position. Pupils 3 mm equal very sluggishly reactive. Doll's movements are sluggish. Right corneal reflex is absent. Left is sluggish.Fundi not visualized. Mild right lower facial weakness. Tongue midline. motor system exam  spontaneousl movements in the left upper and lower extremity. Trace withdrawal in the right upper extremity.No withdrawal in the right lower extremity.intermittent slight tremors of the upper extremities on stimulation right greater than left.    ASSESSMENT/PLAN [name] is a 63 y.o. male with history of Htn, tobacco use and alcohol abuse presenting after being found unrespnsive. He did not receive IV t-PA due to hemorrhage  Bilateral intraparenchymal hemorrhage with a nondisplaced fracture of the skull vertex.etiology likely traumatic and alcohol-related. Doubt hypertensive  Resultant aphasia right hemiplegia  CT head - Bilateral intraparenchymal hemorrhage with a nondisplaced fracture of the skull vertex.  MRI head - not performed  MRA head - not performed  CTA H&N - not performed  Carotid Doppler - not indicated  2D Echo - not indicated  LDL - not performed  HgbA1c - not performed  UDS - negative  VTE prophylaxis - SCDs  Diet - NPO  No antithrombotic prior to admission, now on No antithrombotic  Ongoing aggressive stroke risk factor management  Therapy recommendations:  pending  Disposition:  Pending  Hypertension  Stable . Long-term BP goal normotensive  Hyperlipidemia  Lipid lowering medication PTA:  none  LDL - not indicated, goal < 70  Current lipid lowering medication: none  Continue statin at discharge  Other Stroke Risk Factors  Advanced age  Cigarette smoker - will be advised to stop smoking  ETOH use, advised to drink no more than 1 alcoholic beverage per day.   Other   NS consult - surgery not recommended  ETOH hx -> CIWA protocol  Induced hypernatremia-Hypertonic saline stopped 03/11/19  Anemia - Hb 10.5-> 8.4-> 8.8  Thrombocytopenia - 147->99  Na 134->138->141->141- 161  Mg - 1.3 His prognosis remains poor. Await family decision on withdrawal of care. Hopefully in the next few days  Hospital day # 5   He presented  with a fall at home while intoxicated and presented with altered  mental status and right hemiplegia with bowed significantly limited blood pressure. CT scan shows large left frontal and smaller right frontal hemorrhage with some mass effect. Etiology likely posttraumatic or alcohol-related rather than hypertensive hemorrhage. His serum sodium is above goal at 161.Patient's neurological exam has deteriorated. His prognosis is very poor and chances of survival with meaningful recovery are very bleak. I had a long family meeting at the bedside with patient's wife, daughter and granddaughter  On 03/12/19 using a [place]ese language interpreter through [employer] practitioner. I explained his poor prognosis and family understands. They agreed to DO NOT RESUSCITATE and do not escalate. They need time to discuss with other family members for the next few days before the make a decision about withdrawal of care. Continue  free water 200 mL every 6 hourly via feeding tube to correct his induced hypernatremia. Discussed with Dr. Rudene Christians.   This patient is critically ill and at significant risk of neurological worsening, death and care requires constant monitoring of vital signs, hemodynamics,respiratory and cardiac monitoring, extensive review of multiple databases, frequent neurological assessment, discussion with family, other specialists and medical decision making of high complexity.I have made any additions or clarifications directly to the above note.This critical care time does not reflect procedure time, or teaching time or supervisory time of PA/NP/Med Resident etc but could involve care discussion time.  I spent 30 minutes of neurocritical care time  in the care of  this patient.     Delia Heady, MD Medical Director [facility] Pager: [phone] 2019/04/04 2:16 PM   To contact Stroke Continuity provider, please refer to [email or web address]. After hours, contact General Neurology

## 2019-03-27 NOTE — Death Summary Note (Signed)
DEATH SUMMARY   Patient Details  Name: Jonathon Dennis MRN: [id number] DOB: 04/18/1956  Admission/Discharge Information   Admit Date:  04/07/2019  Date of Death: Date of Death: April 12, 2019  Time of Death: Time of Death: 1616-05-10  Length of Stay: 5  Referring Physician: Julieanne Manson, MD   Reason(s) for Hospitalization  Fall  Diagnoses  Preliminary cause of death: Intracerebral hemorrhage. Secondary Diagnoses (including complications and co-morbidities):  Active Problems:   ICH (intracerebral hemorrhage) (HCC)   Cytotoxic brain edema (HCC)   Brain herniation (HCC)   Depressed skull fracture (HCC)   Fall   Malnutrition of moderate degree   Brief Hospital Course (including significant findings, care, treatment, and services provided and events leading to death)  Jonathon Dennis is a 63 y.o. year old male who sustained a fall down stairs. He had a history of alcohol abuse. He sustained a high grade ICH with high ICH score with cerebral edema and herniation. Prognosis for recovery to good functional status dismal. Family informed via translation line. Agreed to withdrawal of care.    Pertinent Labs and Studies  Significant Diagnostic Studies Ct Angio Head W Or Wo Contrast  Result Date: 03/10/2019 CLINICAL DATA:  63 year old male with acute intraparenchymal, intraventricular and subarachnoid hemorrhage on head CT presenting with altered mental status. EXAM: CT ANGIOGRAPHY HEAD TECHNIQUE: Multidetector CT imaging of the head was performed using the standard protocol during bolus administration of intravenous contrast. Multiplanar CT image reconstructions and MIPs were obtained to evaluate the vascular anatomy. CONTRAST:  58mL ISOVUE-370 IOPAMIDOL (ISOVUE-370) INJECTION 76% COMPARISON:  Head CT 04-07-2019. FINDINGS: Posterior circulation: Dominant left vertebral artery. Left V4 calcified plaque with mild-to-moderate stenosis proximal to the left PICA. Normal left PICA origin and vertebrobasilar  junction. Non dominant distal right vertebral artery is normal to the basilar. The right AICA appears dominant. Patent basilar artery without stenosis. Normal AICA, SCA and PCA origins. Normal right posterior communicating artery, the left is diminutive or absent. Bilateral PCA branches appear normal. Anterior circulation: Both ICA siphons are patent. The distal cervical ICAs appear diminutive. The left siphon is heavily calcified with moderate cavernous segment stenosis. The right siphon is also moderately to severely calcified in the cavernous segment with mild to moderate stenosis. Normal right posterior communicating artery origin. Patent carotid termini. Normal MCA and ACA origins. Mild mass effect on the left ICA terminus. The right A1 appears mildly dominant. Anterior communicating artery and bilateral ACA branches are patent. Rightward mass effect on the ACA branches. Mild distal ACA branch irregularity more resembles atherosclerosis than basal spasm. Right MCA M1 segment and trifurcation are patent without stenosis. Right MCA branches are patent with only mild irregularity. The left MCA M1 and trifurcation are patent without stenosis but appear somewhat diminutive. There is posterior and inferior mass effect on the left MCA branches. No left MCA branch occlusion is identified. The branches are mildly to moderately irregular. No tangle of vessels or vascular malformation identified. No CTA spot sign identified in the left anterior frontal lobe hemorrhage. No CTA spot sign identified in the contralateral smaller right anterior frontal lobe hemorrhage. Venous sinuses: Patent.  No abnormal draining vein identified. Anatomic variants: Dominant left vertebral artery. Delayed phase: Stable left hemisphere intra-axial hemorrhage size and configuration since yesterday. Stable left lateral intraventricular hemorrhage and size. Smaller right anterior superior frontal lobe intra-axial hemorrhage size and configuration  are stable. Trapping of the right lateral ventricle with mild to moderate IVH is stable. Stable 3rd ventricle size and IVH. Fourth  ventricle hemorrhage has regressed. Stable 4th ventricle size. Basilar cistern patency is stable. Rightward midline shift of 10-11 millimeters is stable. Superimposed small volume bilateral subarachnoid hemorrhage is stable, most pronounced in the right sylvian fissure. There is increased para falcine subdural hematoma (series 14, image 27) which is small volume. Possible low-density left middle cranial fossa small subdural hematoma is stable on series 14, image 12. Stable gray-white matter differentiation throughout the brain. No new hypodensity or new cortically based infarct. No abnormal enhancement identified. Vertex skull fracture better demonstrated yesterday. Review of the MIP images confirms the above findings IMPRESSION: 1. Negative for intracranial aneurysm or large vessel occlusion. No vascular malformation or CTA spot sign identified. 2. Probable vasospasm of the left MCA branches. Somewhat diminutive appearance of both ICAs might reflect elevated intracranial pressure. 3. Stable size and configuration of bilateral intra-axial, subarachnoid, and intraventricular hemorrhages since yesterday. Stable trapped right lateral ventricle. Stable to mildly improved rightward midline shift (11 mm). Stable basilar cistern patency. 4. Small volume para falcine subdural hematoma is increased. Questionable small volume left middle cranial fossa subdural. 5. Atherosclerosis with heavily calcified ICA siphons. Moderate left and mild-to-moderate right cavernous ICA stenosis. 6. Dominant left vertebral artery V4 segment is calcified with mild to moderate stenosis. Electronically Signed   By: Odessa Fleming M.D.   On: 03/10/2019 14:34   Ct Head Wo Contrast  Result Date: 04-03-2019 CLINICAL DATA:  63 year old male with altered mental status. EXAM: CT HEAD WITHOUT CONTRAST TECHNIQUE: Contiguous axial  images were obtained from the base of the skull through the vertex without intravenous contrast. COMPARISON:  None. FINDINGS: Brain: Acute LEFT frontoparietal and RIGHT frontal intraparenchymal hemorrhage is noted. The LEFT frontoparietal intraparenchymal hemorrhage measures 5 x 9 cm, extends into the ventricular system, contributing to 1.5 cm LEFT to RIGHT midline shift and RIGHT hydrocephalus. The RIGHT frontal intraparenchymal hemorrhage measures 1.4 x 3.4 cm. Bilateral subarachnoid hemorrhage noted, RIGHT greater than LEFT. 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 Vascular: Carotid atherosclerotic calcifications noted. Skull: A nondisplaced fracture at the skull vertex is noted. Sinuses/Orbits: Mucosal thickening within paranasal sinuses noted. Opacified LEFT sphenoid sinus identified. Other: Oral intubation tube noted. IMPRESSION: 1. Bilateral intraparenchymal hemorrhage, LEFT greater than RIGHT, with intraventricular extension, 1.5 cm LEFT to RIGHT midline shift, RIGHT hydrocephalus, and bilateral subarachnoid hemorrhage,. LEFT frontoparietal intraparenchymal hemorrhage measures 5 x 9 cm and RIGHT frontal hemorrhage measures 1.4 x 3.4 cm. 2. Nondisplaced fracture of the skull vertex. Critical Value/emergent results were called by telephone at the time of interpretation on 04-03-2019 at 3:37 pm to [facility] , who verbally acknowledged these results. Electronically Signed   By: Harmon Pier M.D.   On: 2019-04-03 15:41   Dg Chest Port 1 View  Result Date: 03/13/2019 CLINICAL DATA:  Fever. Hypoxia. EXAM: PORTABLE CHEST 1 VIEW COMPARISON:  03/10/2019 FINDINGS: Endotracheal tube remains in place and terminates 3 cm above the carina. Enteric tube courses into the upper abdomen with tip not imaged. There are new patchy airspace opacities in the right mid lung and right lung base. More extensive airspace consolidation with air bronchograms is present in the left mid lung and left lower lobe. No  sizable pleural effusion or pneumothorax is identified. IMPRESSION: New bilateral airspace opacities concerning for pneumonia. Electronically Signed   By: Sebastian Ache M.D.   On: 03/13/2019 08:12   Dg Chest Port 1 View  Result Date: 03/10/2019 CLINICAL DATA:  Endotracheal placement EXAM: PORTABLE CHEST 1 VIEW COMPARISON:  04/03/2019 FINDINGS:  Endotracheal tube tip is 3 cm above the carina. Nasogastric tube enters the stomach. The lungs remain well aerated. IMPRESSION: Endotracheal tube and nasogastric tube well positioned. Lungs remain well aerated. Electronically Signed   By: Paulina FusiMark  Shogry M.D.   On: 03/10/2019 08:10   Dg Chest Portable 1 View  Result Date: 03-07-19 CLINICAL DATA:  ETT and OG tube placement. EXAM: PORTABLE CHEST 1 VIEW COMPARISON:  March 09, 2019 FINDINGS: The ETT terminates in the mid trachea, in good position. The OG tube terminates below today's film. No pneumothorax. The cardiomediastinal silhouette is normal. The lungs are clear. No other acute abnormalities. IMPRESSION: Support apparatus as above.  No acute abnormalities in the chest. Electronically Signed   By: Gerome Samavid  Williams III M.D   On: 003-12-20 14:55   Dg Chest Port 1 View  Result Date: 03-07-19 CLINICAL DATA:  Altered mental status EXAM: PORTABLE CHEST 1 VIEW COMPARISON:  05/21/2013 FINDINGS: The cardiomediastinal silhouette is unremarkable. There is no evidence of focal airspace disease, pulmonary edema, suspicious pulmonary nodule/mass, pleural effusion, or pneumothorax. No acute bony abnormalities are identified. IMPRESSION: No active disease. Electronically Signed   By: Harmon PierJeffrey  Hu M.D.   On: 003-12-20 12:31   Dg Abd Portable 1v  Result Date: 03/10/2019 CLINICAL DATA:  Orogastric placement EXAM: PORTABLE ABDOMEN - 1 VIEW COMPARISON:  None. FINDINGS: Orogastric tube enters the stomach in has its tip in the antrum. Upper abdominal bowel gas pattern is normal. IMPRESSION: Orogastric tube tip in the gastric  antrum. Electronically Signed   By: Paulina FusiMark  Shogry M.D.   On: 03/10/2019 08:11    Microbiology Recent Results (from the past 240 hour(s))  Culture, blood (routine x 2)     Status: None   Collection Time: 01/07/19 11:30 AM  Result Value Ref Range Status   Specimen Description BLOOD RIGHT ARM  Final   Special Requests   Final    BOTTLES DRAWN AEROBIC AND ANAEROBIC Blood Culture adequate volume   Culture   Final    NO GROWTH 5 DAYS Performed at [facility], [address] [place], [place] [address]    Report Status 03/24/2019 FINAL  Final  Culture, blood (routine x 2)     Status: None   Collection Time: 01/07/19 11:35 AM  Result Value Ref Range Status   Specimen Description BLOOD RIGHT HAND  Final   Special Requests   Final    BOTTLES DRAWN AEROBIC AND ANAEROBIC Blood Culture adequate volume   Culture   Final    NO GROWTH 5 DAYS Performed at [facility], [address] [place], [place] [address]    Report Status 03/25/2019 FINAL  Final  MRSA PCR Screening     Status: None   Collection Time: 01/07/19  8:49 PM  Result Value Ref Range Status   MRSA by PCR NEGATIVE NEGATIVE Final    Comment:        The GeneXpert MRSA Assay (FDA approved for NASAL specimens only), is one component of a comprehensive MRSA colonization surveillance program. It is not intended to diagnose MRSA infection nor to guide or monitor treatment for MRSA infections. Performed at [facility], [address] [place], [place] [address]   Culture, respiratory (non-expectorated)     Status: None (Preliminary result)   Collection Time: 03/13/19  8:38 AM  Result Value Ref Range Status   Specimen Description TRACHEAL ASPIRATE  Final   Special Requests NONE  Final   Gram Stain  Final    RARE WBC PRESENT, PREDOMINANTLY MONONUCLEAR FEW GRAM NEGATIVE RODS RARE GRAM POSITIVE COCCI IN PAIRS IN CLUSTERS    Culture   Final    MODERATE STAPHYLOCOCCUS AUREUS MODERATE KLEBSIELLA  PNEUMONIAE SUSCEPTIBILITIES TO FOLLOW Performed at [facility], [address] [place], [place] [address]    Report Status PENDING  Incomplete    Lab Basic Metabolic Panel: Recent Labs  Lab 03/10/19 0304 03/10/19 0422  03/11/19 0522  03/12/19 9147  03/13/19 0537  03/13/19 1637 03/13/19 1834 03/20/2019 0001 03/25/2019 0540 03/10/2019 0733 02/28/2019 1138  NA 138 141   < > 149*   < > 160*   < > 161*   < >  --  160* 159* 161* 160* 159*  K 4.1 3.9  --  3.2*  --  3.2*  --  3.6  --   --   --   --   --  3.5  --   CL 105  --   --  121*  --  >130*  --  >130*  --   --   --   --   --  >130*  --   CO2 24  --   --  23  --  22  --  22  --   --   --   --   --  22  --   GLUCOSE 129*  --   --  134*  --  136*  --  158*  --   --   --   --   --  205*  --   BUN 27*  --   --  12  --  13  --  24*  --   --   --   --   --  36*  --   CREATININE 0.97  --   --  0.77  --  0.94  --  1.09  --   --   --   --   --  1.21  --   CALCIUM 8.4*  --   --  8.4*  --  8.6*  --  9.0  --   --   --   --   --  8.9  --   MG 1.3*  --   --   --   --  1.6*  --  2.0  --  1.9  --  2.1  --   --   --   PHOS 3.4  --   --   --   --  3.5  --  3.0  --  1.5*  --  2.1*  --   --   --    < > = values in this interval not displayed.   Liver Function Tests: Recent Labs  Lab 03/28/2019 1130 03/11/19 0522  AST 86* 54*  ALT 56* 32  ALKPHOS 64 46  BILITOT 1.1 1.6*  PROT 9.1* 7.5  ALBUMIN 3.6 2.8*   Recent Labs  Lab 28-Mar-2019 1130  LIPASE 24   No results for input(s): AMMONIA in the last 168 hours. CBC: Recent Labs  Lab 2019-03-28 1130  03/10/19 0304 03/10/19 0422 03/11/19 0522 03/12/19 8295 03/13/19 0537 03/11/2019 0733  WBC 9.5  --  6.4  --  7.1 2.7* 4.5 4.8  NEUTROABS 8.3*  --   --   --   --   --   --  3.7  HGB 10.5*   < > 8.4* 8.8* 8.7* 9.1* 8.3*  7.9*  HCT 33.0*   < > 27.3* 26.0* 27.3* 31.3* 28.0* 25.4*  MCV 63.0*  --  62.6*  --  64.7* 67.6* 65.7* 63.7*  PLT 147*  --  99*  --  89* 103* 85* 72*   < > = values in this  interval not displayed.   Cardiac Enzymes: Recent Labs  Lab 03/11/2019 1130  TROPONINI <0.03   Sepsis Labs: Recent Labs  Lab 02/25/2019 1130 03/10/2019 1335  03/11/19 0522 03/12/19 0613 03/13/19 0537 03/13/19 1132 03/20/19 0001 March 20, 2019 0733  PROCALCITON  --   --   --   --   --   --  7.32 8.35  --   WBC 9.5  --    < > 7.1 2.7* 4.5  --   --  4.8  LATICACIDVEN 4.9* 5.0*  --   --   --   --   --   --   --    < > = values in this interval not displayed.    Procedures/Operations  Mechanical ventilation.   Jamair Cato 03/15/2019, 3:09 PM

## 2019-03-27 NOTE — Progress Notes (Signed)
Pt deceased at 12.  Pronounced by Londell Moh, RN and Dicie Beam, RN.  Family at bedside.  Emotional support provided to family.  Gave family information on cremation services and also gave family patient placement card.  Notified family to call patient placement when they know what services they will be using.  Wasted 42ml of morphine gtt with Dicie Beam, RN.

## 2019-03-27 NOTE — Procedures (Signed)
Extubation Procedure Note  Patient Details:   Name: Jonathon Dennis DOB: March 05, 1956 MRN: [id number]   Airway Documentation:    Vent end date: 2019/03/15 Vent end time: 1423   Evaluation  Pt extubated to RA per Withdrawal of Life Protocol   Carolan Shiver 2019-03-15, 2:24 PM

## 2019-03-27 NOTE — Progress Notes (Signed)
NAMEKingmessiah Dennis, MRN:  [id number], DOB:  1956-09-25, LOS: 5 ADMISSION DATE:  03/04/2019, CONSULTATION DATE:  03/01/2019 REFERRING MD:  EDP - [place], CHIEF COMPLAINT:  ICH and VDRF  Brief History   63 year old with history of etoh abuse and hypertension who presents to PCCM with a massive ICH after wife called EMS after not being able to wake up the patient.  Patient was covered in bloody vomit and snoring loudly.  Patient had consumed a lot of alcohol the night prior to presentation.  Patient was brought to the ED where he was intubated.  ED called NS and patient is not a surgical candidate.  PCCM was called to admit to the ICU.  History is obtained via remote translator.  Agreed upon continued supportive care and no escalation (with full DNR).  Past Medical History  Etoh abuse HTN  Significant Hospital Events   3/14 admission for ICH and GI bleed, not surgical candidate  3/15 localizing and tremulous w/ agitation. Sedated  3/17 family meeting using [employer] interpreter Jonathon Dennis).  Family asking to wait until Friday 3/20 so that they can update remaining family and local close friends and together make a decision.  Consults:  Neurology Neurosurgery  Procedures:  N/A  Significant Diagnostic Tests:  CT of the head 3/14 that I reviewed myself, massive ICH CTA head 3/15 > negative for aneurysm or large vessel occlusion, no vascular malformations.  Probable vasospasm of the left MCA branches.  Stable SAH and IVH.  Small volume para falcine SDH is increased.  Micro Data:  N/A  Antimicrobials:  N/A   Interim history/subjective:  Started abx yesterday for possible PNA.   Objective   Blood pressure (!) 164/81, pulse 87, temperature 99.5 F (37.5 C), resp. rate (!) 34, height 5\' 7"  (1.702 m), weight 61.9 kg, SpO2 93 %.    Vent Mode: PRVC FiO2 (%):  [40 %] 40 % Set Rate:  [15 bmp] 15 bmp Vt Set:  [520 mL] 520 mL PEEP:  [5 cmH20] 5 cmH20 Plateau Pressure:  [20 cmH20-25 cmH20] 25 cmH20    Intake/Output Summary (Last 24 hours) at 04/07/2019 0928 Last data filed at April 07, 2019 0700 Gross per 24 hour  Intake 2110.26 ml  Output 1095 ml  Net 1015.26 ml   Filed Weights   03/12/19 0500 03/13/19 0453 2019/04/07 0500  Weight: 60.1 kg 59.9 kg 61.9 kg    Examination:  General: Elderly appearing male in NAD Neuro: spontaneous movement of LUE. RUE with minimal response. BLE with withdrawal to pain.  Pulm: Diminished Card: Tachy, regular. No MRG GI:  BS x 4, S/NT/ND Ext: no edema.  Assessment & Plan:  63 year old male with history of HTN and etoh abuse who presents to PCCM with respiratory failure a large ICH that is not operable.  Acute Encephalopathy 2/2 inoperable ICH: S/p 3% NaCL stopped 3/16  At risk for w/d  Plan Family to discuss with extended family overseas and notify us by Friday 3/20 regarding their plans Continue thiamine and folate PRN fentanyl and versed for RASS goal 0.   HTN - Cleviprex back on 3/18 Plan Home metoprolol via tube.  Start amlodipine DC cleviprex BP goal ok up to per stroke.  Monitor  Acute respiratory failure w/ kikely aspiration: hypoxemic on exam 3/18 requiring increase in Fio2 and frequent suctioning of tan secretions. Has been febrile throughout admission. CXR describes consolidation. Worsening hypoxia 3/19 after starting ABX 3/19.  Plan Cont full vent support ABX  as above. Follow cultures.  FiO2 and PEEP increased VAP bundle Tracheal aspirate  UGIB w/ associated Anemia ->? ETOH related gastritis?  ->not candidate for EGD Plan PPI BID dosing Trend CBC  Hypokalemia Plan Follow BMP  Hypomagnesemia Plan Follow BMP  GOC:  Family meeting using [employer] interpreter Jonathon Dennis) 3/17.  Family asking to wait until Friday 3/20 so that they can update remaining family and local close friends and together make a decision.  Agreed upon continued supportive care with no escalation (and full DNR). No family available at bedside.  Spoke with sister over phone, who will have his wife call us today for further discuss goals.   Best practice    DVT: ok to start lovenox per stroke service.  Diet: NPO - TF SUP: PPI gtt VAP: in place Sedation: fentanyl and versed PRN Dispo: as above  CC time: 30 min   Jonathon Dennis, AGACNP-BC [facility] Pulmonary/Critical Care Pager [phone] or [phone]  03-29-2019 9:28 AM

## 2019-03-27 NOTE — Progress Notes (Signed)
Nutrition Brief Note  Chart reviewed. Pt being terminally extubated for comfort care.  No further nutrition interventions warranted at this time.  Please re-consult as needed.   Kendell Bane RD, LDN, CNSC [phone] Pager [phone] After Hours Pager

## 2019-03-27 DEATH — deceased

## 2020-10-26 IMAGING — CT CT ANGIOGRAPHY HEAD
1 of 8 series · 6 of 47 positions shown · IV contrast (iopamidol)
Comparison: Head CT 03/09/2019.

CLINICAL DATA: 63-year-old male with acute intraparenchymal,
intraventricular and subarachnoid hemorrhage on head CT presenting
with altered mental status.

EXAM:
CT ANGIOGRAPHY HEAD
TECHNIQUE: Multidetector CT imaging of the head was performed using the
standard protocol during bolus administration of intravenous
contrast. Multiplanar CT image reconstructions and MIPs were
obtained to evaluate the vascular anatomy.
CONTRAST:  50mL [id number] IOPAMIDOL ([id number]-[id number]) INJECTION 76%

[Series 6: cow 2.0 · axial · 0.47mm/px · z∈[-138,-18]mm · 6 of 85 slices shown]
[im 13/85  brain]
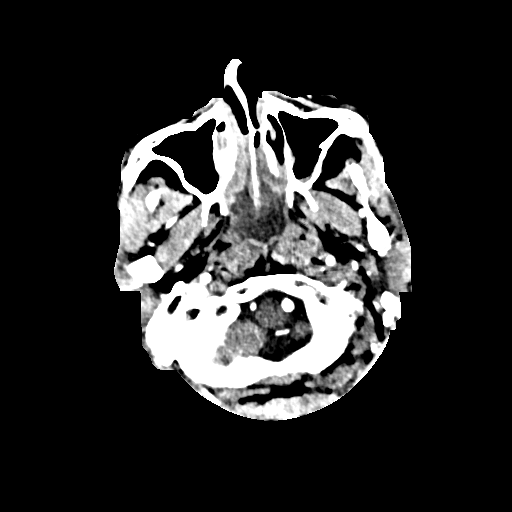
[im 25/85  bone]
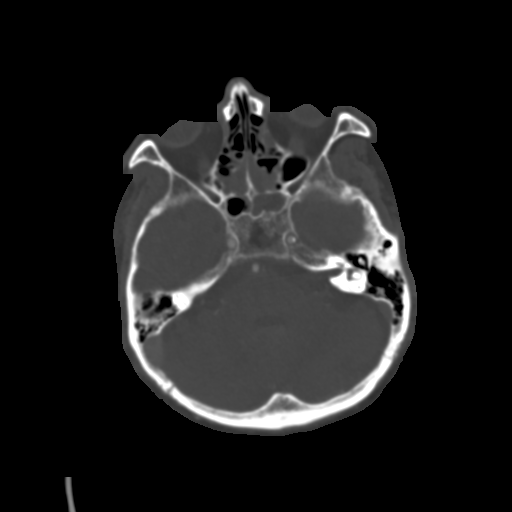
[im 37/85  brain]
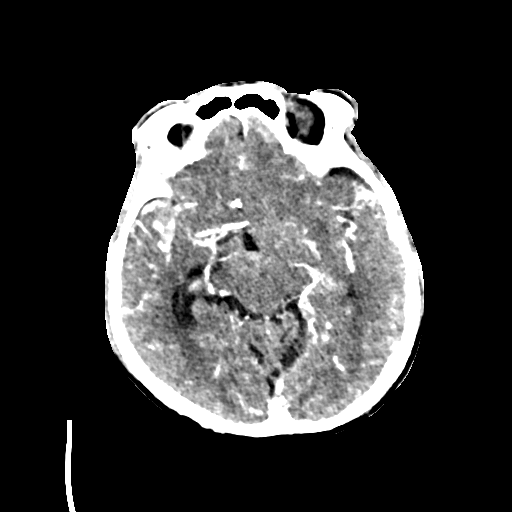
[im 49/85  bone]
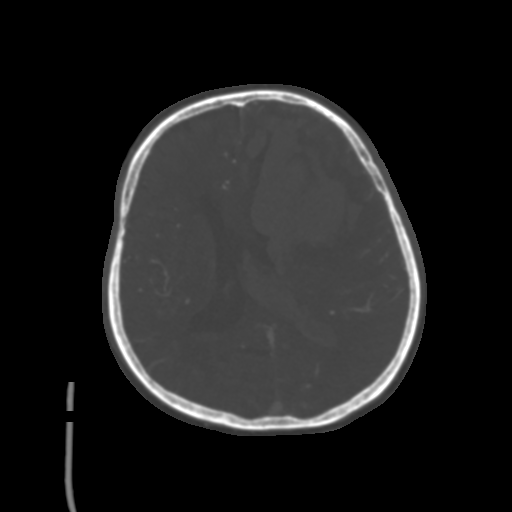
[im 61/85  brain]
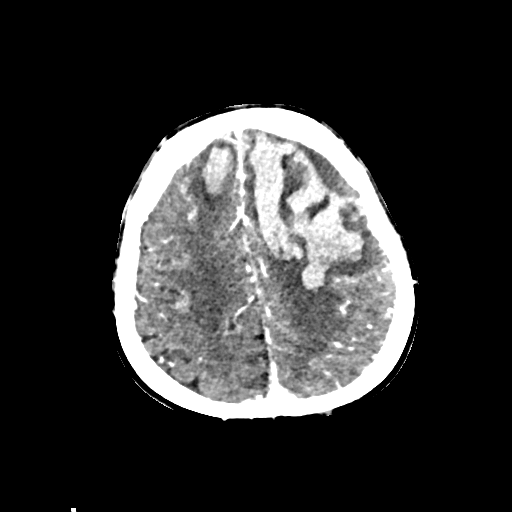
[im 73/85  bone]
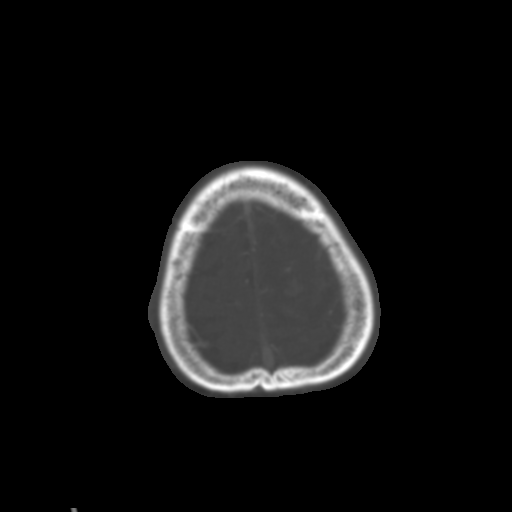

[6 of 47 positions shown; findings below may reference images not displayed]

FINDINGS: Posterior circulation: Dominant left vertebral artery. Left V4
calcified plaque with mild-to-moderate stenosis proximal to the left
PICA. Normal left PICA origin and vertebrobasilar junction. Non
dominant distal right vertebral artery is normal to the basilar. The
right AICA appears dominant.

Patent basilar artery without stenosis. Normal AICA, SCA and PCA
origins. Normal right posterior communicating artery, the left is
diminutive or absent. Bilateral PCA branches appear normal.

Anterior circulation: Both ICA siphons are patent. The distal
cervical ICAs appear diminutive. The left siphon is heavily
calcified with moderate cavernous segment stenosis. The right siphon
is also moderately to severely calcified in the cavernous segment
with mild to moderate stenosis. Normal right posterior communicating
artery origin.

Patent carotid termini. Normal MCA and ACA origins. Mild mass effect
on the left ICA terminus. The right A1 appears mildly dominant.
Anterior communicating artery and bilateral ACA branches are patent.
Rightward mass effect on the ACA branches. Mild distal ACA branch
irregularity more resembles atherosclerosis than basal spasm.

Right MCA M1 segment and trifurcation are patent without stenosis.
Right MCA branches are patent with only mild irregularity.

The left MCA M1 and trifurcation are patent without stenosis but
appear somewhat diminutive. There is posterior and inferior mass
effect on the left MCA branches. No left MCA branch occlusion is
identified. The branches are mildly to moderately irregular.

No tangle of vessels or vascular malformation identified. No CTA
spot sign identified in the left anterior frontal lobe hemorrhage.
No CTA spot sign identified in the contralateral smaller right
anterior frontal lobe hemorrhage.

Venous sinuses: Patent.  No abnormal draining vein identified.

Anatomic variants: Dominant left vertebral artery.

Delayed phase:

Stable left hemisphere intra-axial hemorrhage size and configuration
since yesterday. Stable left lateral intraventricular hemorrhage and
size.

Smaller right anterior superior frontal lobe intra-axial hemorrhage
size and configuration are stable. Trapping of the right lateral
ventricle with mild to moderate IVH is stable.

Stable 3rd ventricle size and IVH. Fourth ventricle hemorrhage has
regressed. Stable 4th ventricle size.

Basilar cistern patency is stable. Rightward midline shift of 10-11
millimeters is stable. Superimposed small volume bilateral
subarachnoid hemorrhage is stable, most pronounced in the right
sylvian fissure.

There is increased para falcine subdural hematoma (series 14, image
27) which is small volume. Possible low-density left middle cranial
fossa small subdural hematoma is stable on series 14, image 12.

Stable gray-white matter differentiation throughout the brain. No
new hypodensity or new cortically based infarct. No abnormal
enhancement identified.

Vertex skull fracture better demonstrated yesterday.

Review of the MIP images confirms the above findings
IMPRESSION: 1. Negative for intracranial aneurysm or large vessel occlusion. No
vascular malformation or CTA spot sign identified.
2. Probable vasospasm of the left MCA branches. Somewhat diminutive
appearance of both ICAs might reflect elevated intracranial
pressure.
3. Stable size and configuration of bilateral intra-axial,
subarachnoid, and intraventricular hemorrhages since yesterday.
Stable trapped right lateral ventricle. Stable to mildly improved
rightward midline shift (11 mm). Stable basilar cistern patency.
4. Small volume para falcine subdural hematoma is increased.
Questionable small volume left middle cranial fossa subdural.
5. Atherosclerosis with heavily calcified ICA siphons. Moderate left
and mild-to-moderate right cavernous ICA stenosis.
6. Dominant left vertebral artery V4 segment is calcified with mild
to moderate stenosis.

## 2020-10-29 IMAGING — DX PORTABLE CHEST - 1 VIEW
1 series · 1 of 1 positions shown · non-contrast
Comparison: 03/10/2019

CLINICAL DATA: Fever. Hypoxia.

EXAM:
PORTABLE CHEST 1 VIEW

[chest ap]
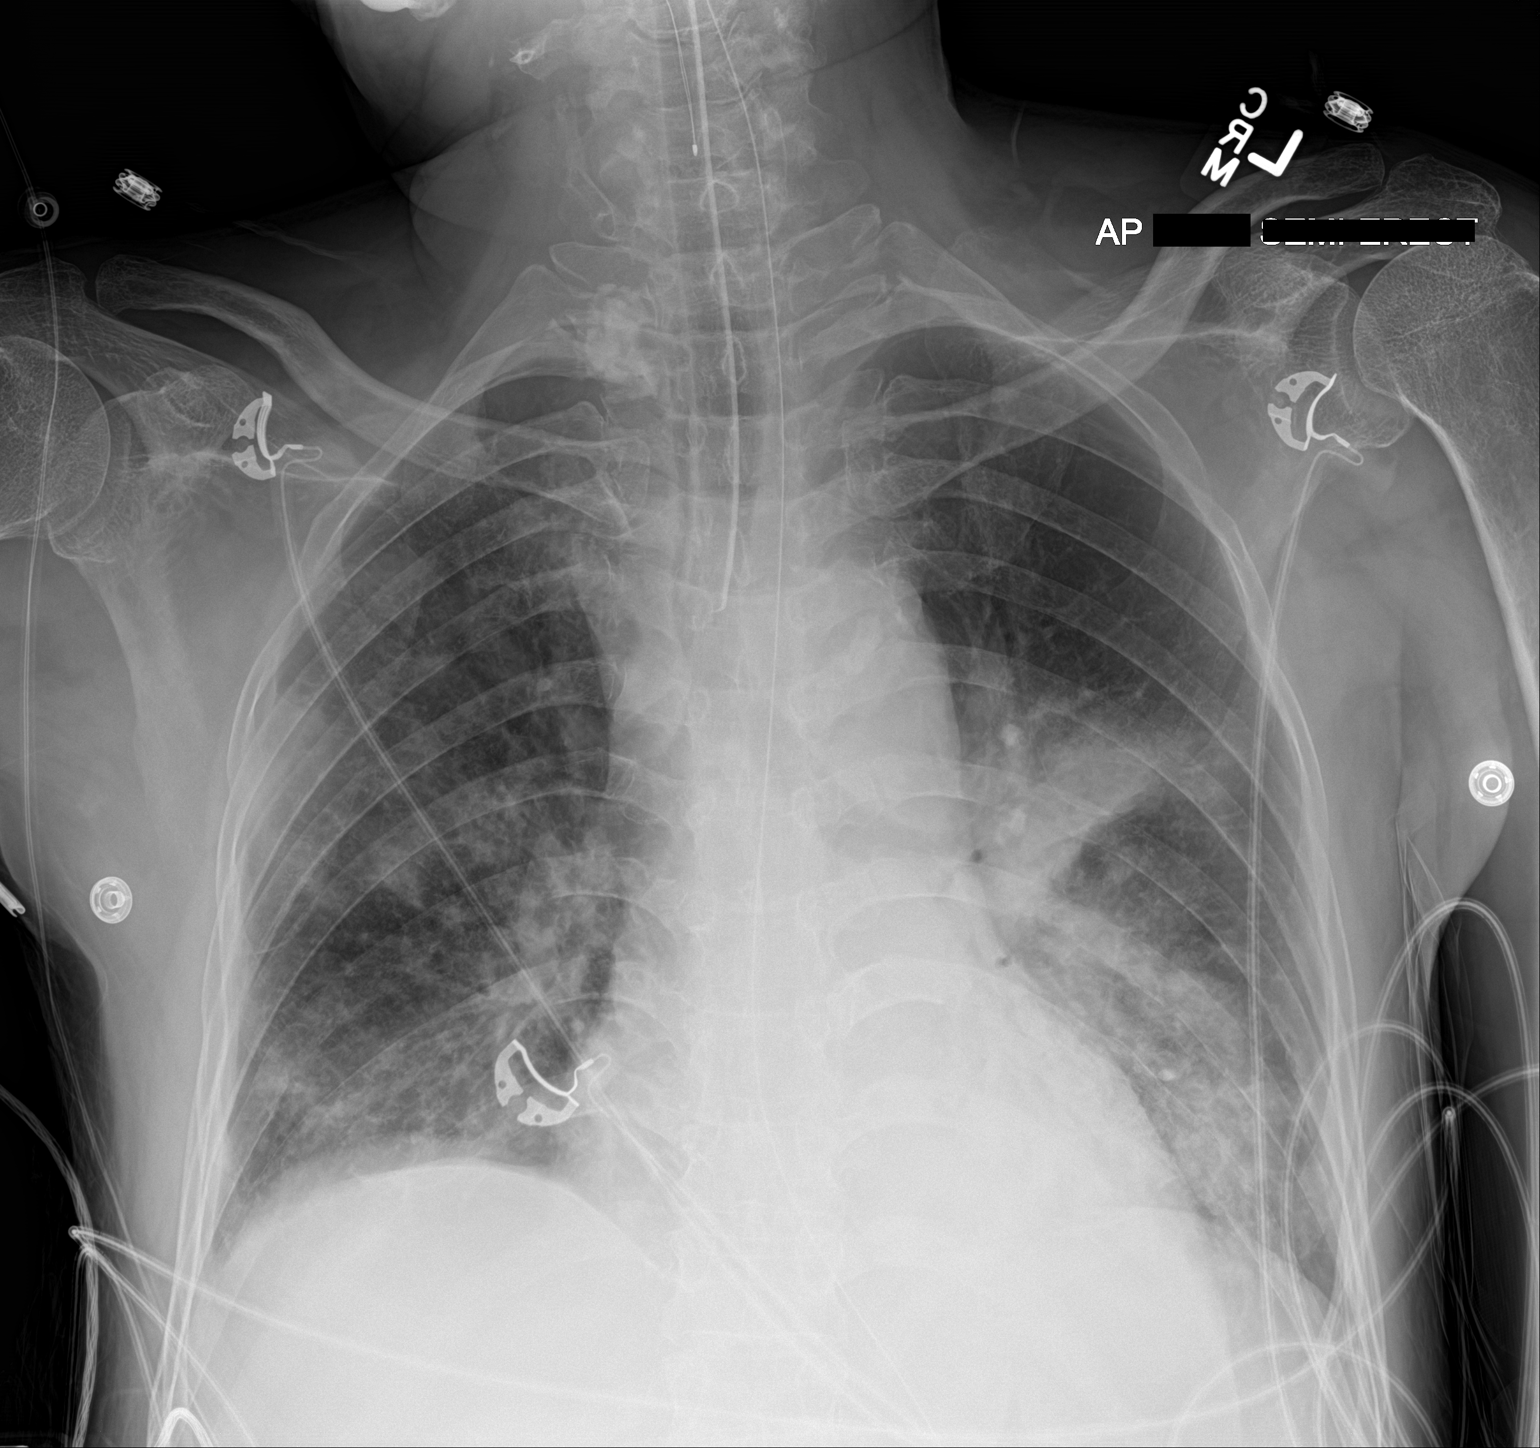

[1 of 1 positions shown; findings below may reference images not displayed]

FINDINGS: Endotracheal tube remains in place and terminates 3 cm above the
carina. Enteric tube courses into the upper abdomen with tip not
imaged. There are new patchy airspace opacities in the right mid
lung and right lung base. More extensive airspace consolidation with
air bronchograms is present in the left mid lung and left lower
lobe. No sizable pleural effusion or pneumothorax is identified.
IMPRESSION: New bilateral airspace opacities concerning for pneumonia.
